# Patient Record
Sex: Female | Born: 1960 | Race: White | Hispanic: No | Marital: Single | Smoking: Former smoker
Health system: Southern US, Community
[De-identification: ages and names within clinical notes are randomized; demographics above are authoritative.]

## PROBLEM LIST (undated history)

## (undated) DIAGNOSIS — F32A Depression, unspecified: Secondary | ICD-10-CM

## (undated) DIAGNOSIS — F419 Anxiety disorder, unspecified: Secondary | ICD-10-CM

## (undated) DIAGNOSIS — F329 Major depressive disorder, single episode, unspecified: Secondary | ICD-10-CM

## (undated) DIAGNOSIS — R490 Dysphonia: Secondary | ICD-10-CM

## (undated) DIAGNOSIS — M545 Low back pain, unspecified: Secondary | ICD-10-CM

## (undated) DIAGNOSIS — T7840XA Allergy, unspecified, initial encounter: Secondary | ICD-10-CM

## (undated) DIAGNOSIS — E669 Obesity, unspecified: Secondary | ICD-10-CM

## (undated) DIAGNOSIS — IMO0002 Reserved for concepts with insufficient information to code with codable children: Secondary | ICD-10-CM

## (undated) DIAGNOSIS — G4733 Obstructive sleep apnea (adult) (pediatric): Secondary | ICD-10-CM

## (undated) HISTORY — PX: TONSILLECTOMY: SUR1361

## (undated) HISTORY — DX: Major depressive disorder, single episode, unspecified: F32.9

## (undated) HISTORY — DX: Obesity, unspecified: E66.9

## (undated) HISTORY — DX: Low back pain: M54.5

## (undated) HISTORY — DX: Depression, unspecified: F32.A

## (undated) HISTORY — DX: Low back pain, unspecified: M54.50

## (undated) HISTORY — DX: Anxiety disorder, unspecified: F41.9

## (undated) HISTORY — DX: Dysphonia: R49.0

## (undated) HISTORY — DX: Allergy, unspecified, initial encounter: T78.40XA

## (undated) HISTORY — DX: Reserved for concepts with insufficient information to code with codable children: IMO0002

## (undated) HISTORY — PX: OTHER SURGICAL HISTORY: SHX169

## (undated) HISTORY — DX: Obstructive sleep apnea (adult) (pediatric): G47.33

---

## 2006-01-23 ENCOUNTER — Ambulatory Visit: Payer: Self-pay | Admitting: Family Medicine

## 2006-01-23 DIAGNOSIS — F17200 Nicotine dependence, unspecified, uncomplicated: Secondary | ICD-10-CM | POA: Insufficient documentation

## 2006-01-23 DIAGNOSIS — G4733 Obstructive sleep apnea (adult) (pediatric): Secondary | ICD-10-CM

## 2006-02-07 ENCOUNTER — Ambulatory Visit: Payer: Self-pay | Admitting: Family Medicine

## 2006-04-17 ENCOUNTER — Ambulatory Visit: Payer: Self-pay | Admitting: Family Medicine

## 2006-05-07 ENCOUNTER — Encounter: Payer: Self-pay | Admitting: Family Medicine

## 2006-05-21 ENCOUNTER — Telehealth: Payer: Self-pay | Admitting: Family Medicine

## 2006-05-22 ENCOUNTER — Telehealth: Payer: Self-pay | Admitting: Family Medicine

## 2006-05-26 ENCOUNTER — Encounter: Payer: Self-pay | Admitting: Family Medicine

## 2006-06-11 ENCOUNTER — Telehealth (INDEPENDENT_AMBULATORY_CARE_PROVIDER_SITE_OTHER): Payer: Self-pay | Admitting: *Deleted

## 2006-06-13 ENCOUNTER — Telehealth (INDEPENDENT_AMBULATORY_CARE_PROVIDER_SITE_OTHER): Payer: Self-pay | Admitting: *Deleted

## 2006-06-26 ENCOUNTER — Encounter: Payer: Self-pay | Admitting: Family Medicine

## 2006-07-09 ENCOUNTER — Telehealth: Payer: Self-pay | Admitting: Family Medicine

## 2006-07-15 ENCOUNTER — Ambulatory Visit: Payer: Self-pay | Admitting: Family Medicine

## 2006-08-05 ENCOUNTER — Telehealth: Payer: Self-pay | Admitting: Family Medicine

## 2006-09-05 ENCOUNTER — Ambulatory Visit: Payer: Self-pay | Admitting: Family Medicine

## 2006-09-05 DIAGNOSIS — G47419 Narcolepsy without cataplexy: Secondary | ICD-10-CM | POA: Insufficient documentation

## 2006-09-05 DIAGNOSIS — N949 Unspecified condition associated with female genital organs and menstrual cycle: Secondary | ICD-10-CM

## 2006-09-05 DIAGNOSIS — M545 Low back pain: Secondary | ICD-10-CM

## 2006-09-05 LAB — CONVERTED CEMR LAB: Hemoglobin: 15.2 g/dL

## 2006-09-12 ENCOUNTER — Ambulatory Visit: Payer: Self-pay | Admitting: Pulmonary Disease

## 2006-10-03 ENCOUNTER — Ambulatory Visit (HOSPITAL_BASED_OUTPATIENT_CLINIC_OR_DEPARTMENT_OTHER): Admission: RE | Admit: 2006-10-03 | Discharge: 2006-10-03 | Payer: Self-pay | Admitting: Pulmonary Disease

## 2006-10-03 ENCOUNTER — Ambulatory Visit: Payer: Self-pay | Admitting: Pulmonary Disease

## 2006-10-10 ENCOUNTER — Telehealth: Payer: Self-pay | Admitting: Family Medicine

## 2006-10-19 ENCOUNTER — Encounter: Payer: Self-pay | Admitting: Family Medicine

## 2006-10-19 LAB — CONVERTED CEMR LAB
AST: 12 units/L (ref 0–37)
BUN: 13 mg/dL (ref 6–23)
Calcium: 9.6 mg/dL (ref 8.4–10.5)
LDL Cholesterol: 135 mg/dL — ABNORMAL HIGH (ref 0–99)
MCHC: 33.1 g/dL (ref 30.0–36.0)
MCV: 97.9 fL (ref 78.0–100.0)
Platelets: 262 10*3/uL (ref 150–400)
RDW: 13.6 % (ref 11.5–14.0)
Triglycerides: 124 mg/dL (ref <150)
VLDL: 25 mg/dL (ref 0–40)
WBC: 7.6 10*3/uL (ref 4.0–10.5)

## 2006-10-21 ENCOUNTER — Encounter: Payer: Self-pay | Admitting: Family Medicine

## 2006-12-12 ENCOUNTER — Ambulatory Visit: Payer: Self-pay | Admitting: Family Medicine

## 2006-12-12 ENCOUNTER — Encounter: Admission: RE | Admit: 2006-12-12 | Discharge: 2006-12-12 | Payer: Self-pay | Admitting: Family Medicine

## 2006-12-17 ENCOUNTER — Ambulatory Visit: Payer: Self-pay | Admitting: Family Medicine

## 2006-12-18 ENCOUNTER — Telehealth: Payer: Self-pay | Admitting: Family Medicine

## 2007-02-17 ENCOUNTER — Ambulatory Visit: Payer: Self-pay | Admitting: Family Medicine

## 2007-02-17 DIAGNOSIS — J42 Unspecified chronic bronchitis: Secondary | ICD-10-CM

## 2007-02-19 ENCOUNTER — Encounter: Payer: Self-pay | Admitting: Family Medicine

## 2007-02-21 ENCOUNTER — Encounter: Payer: Self-pay | Admitting: Family Medicine

## 2007-03-24 ENCOUNTER — Ambulatory Visit: Payer: Self-pay | Admitting: Pulmonary Disease

## 2007-03-27 ENCOUNTER — Ambulatory Visit: Payer: Self-pay | Admitting: Family Medicine

## 2007-03-27 ENCOUNTER — Encounter: Admission: RE | Admit: 2007-03-27 | Discharge: 2007-03-27 | Payer: Self-pay | Admitting: Family Medicine

## 2007-03-27 DIAGNOSIS — M542 Cervicalgia: Secondary | ICD-10-CM | POA: Insufficient documentation

## 2007-06-25 ENCOUNTER — Encounter: Payer: Self-pay | Admitting: Family Medicine

## 2007-06-30 ENCOUNTER — Telehealth (INDEPENDENT_AMBULATORY_CARE_PROVIDER_SITE_OTHER): Payer: Self-pay | Admitting: *Deleted

## 2007-07-01 ENCOUNTER — Ambulatory Visit: Payer: Self-pay | Admitting: Pulmonary Disease

## 2007-07-01 DIAGNOSIS — G4721 Circadian rhythm sleep disorder, delayed sleep phase type: Secondary | ICD-10-CM

## 2007-07-09 ENCOUNTER — Telehealth (INDEPENDENT_AMBULATORY_CARE_PROVIDER_SITE_OTHER): Payer: Self-pay | Admitting: *Deleted

## 2007-07-09 ENCOUNTER — Encounter: Payer: Self-pay | Admitting: Family Medicine

## 2007-11-14 ENCOUNTER — Telehealth: Payer: Self-pay | Admitting: Family Medicine

## 2008-03-03 ENCOUNTER — Ambulatory Visit: Payer: Self-pay | Admitting: Family Medicine

## 2008-03-03 DIAGNOSIS — F329 Major depressive disorder, single episode, unspecified: Secondary | ICD-10-CM

## 2008-03-03 DIAGNOSIS — R498 Other voice and resonance disorders: Secondary | ICD-10-CM | POA: Insufficient documentation

## 2008-03-03 DIAGNOSIS — B009 Herpesviral infection, unspecified: Secondary | ICD-10-CM | POA: Insufficient documentation

## 2008-03-17 ENCOUNTER — Encounter: Payer: Self-pay | Admitting: Family Medicine

## 2008-03-25 ENCOUNTER — Ambulatory Visit: Payer: Self-pay | Admitting: Family Medicine

## 2008-04-16 ENCOUNTER — Encounter: Payer: Self-pay | Admitting: Family Medicine

## 2008-04-19 ENCOUNTER — Telehealth: Payer: Self-pay | Admitting: Family Medicine

## 2008-05-04 ENCOUNTER — Ambulatory Visit: Payer: Self-pay | Admitting: Pulmonary Disease

## 2008-05-10 ENCOUNTER — Encounter: Payer: Self-pay | Admitting: Pulmonary Disease

## 2008-05-27 ENCOUNTER — Telehealth: Payer: Self-pay | Admitting: Family Medicine

## 2008-06-03 ENCOUNTER — Ambulatory Visit: Payer: Self-pay | Admitting: Family Medicine

## 2008-06-03 DIAGNOSIS — F411 Generalized anxiety disorder: Secondary | ICD-10-CM

## 2008-06-10 ENCOUNTER — Ambulatory Visit: Payer: Self-pay | Admitting: Family Medicine

## 2008-06-10 DIAGNOSIS — IMO0001 Reserved for inherently not codable concepts without codable children: Secondary | ICD-10-CM | POA: Insufficient documentation

## 2008-06-10 DIAGNOSIS — M2550 Pain in unspecified joint: Secondary | ICD-10-CM | POA: Insufficient documentation

## 2008-06-11 ENCOUNTER — Encounter: Payer: Self-pay | Admitting: Family Medicine

## 2008-06-14 ENCOUNTER — Telehealth: Payer: Self-pay | Admitting: Family Medicine

## 2008-06-14 DIAGNOSIS — E039 Hypothyroidism, unspecified: Secondary | ICD-10-CM | POA: Insufficient documentation

## 2008-06-14 LAB — CONVERTED CEMR LAB
AST: 11 units/L (ref 0–37)
Alkaline Phosphatase: 68 units/L (ref 39–117)
Anti Nuclear Antibody(ANA): POSITIVE — AB
BUN: 13 mg/dL (ref 6–23)
Basophils Relative: 1 % (ref 0–1)
Calcium: 10.2 mg/dL (ref 8.4–10.5)
Cholesterol: 182 mg/dL (ref 0–200)
Creatinine, Ser: 0.71 mg/dL (ref 0.40–1.20)
HCT: 46.6 % — ABNORMAL HIGH (ref 36.0–46.0)
HDL: 44 mg/dL (ref >39)
LDL Cholesterol: 113 mg/dL — ABNORMAL HIGH (ref 0–99)
Lymphs Abs: 3.1 10*3/uL (ref 0.7–4.0)
MCHC: 34.3 g/dL (ref 30.0–36.0)
Neutro Abs: 3.2 10*3/uL (ref 1.7–7.7)
Neutrophils Relative %: 44 % (ref 43–77)
Platelets: 232 10*3/uL (ref 150–400)
RBC: 4.85 M/uL (ref 3.87–5.11)
RDW: 14 % (ref 11.5–15.5)
Sodium: 141 meq/L (ref 135–145)
TSH: 5.646 microintl units/mL — ABNORMAL HIGH (ref 0.350–4.500)
Total Bilirubin: 0.4 mg/dL (ref 0.3–1.2)
Total CHOL/HDL Ratio: 4.1
Total CK: 33 units/L (ref 7–177)
Triglycerides: 124 mg/dL (ref <150)
VLDL: 25 mg/dL (ref 0–40)
WBC: 7.3 10*3/uL (ref 4.0–10.5)

## 2008-06-21 ENCOUNTER — Encounter: Payer: Self-pay | Admitting: Family Medicine

## 2008-07-14 ENCOUNTER — Other Ambulatory Visit: Admission: RE | Admit: 2008-07-14 | Discharge: 2008-07-14 | Payer: Self-pay | Admitting: Obstetrics & Gynecology

## 2008-07-14 ENCOUNTER — Encounter: Payer: Self-pay | Admitting: Obstetrics & Gynecology

## 2008-07-14 ENCOUNTER — Ambulatory Visit: Payer: Self-pay | Admitting: Obstetrics & Gynecology

## 2008-07-16 ENCOUNTER — Ambulatory Visit: Payer: Self-pay | Admitting: Physical Medicine & Rehabilitation

## 2008-07-16 ENCOUNTER — Encounter
Admission: RE | Admit: 2008-07-16 | Discharge: 2008-07-16 | Payer: Self-pay | Admitting: Physical Medicine & Rehabilitation

## 2008-07-19 ENCOUNTER — Ambulatory Visit: Payer: Self-pay | Admitting: Family Medicine

## 2008-07-19 ENCOUNTER — Encounter: Admission: RE | Admit: 2008-07-19 | Discharge: 2008-07-19 | Payer: Self-pay | Admitting: Obstetrics & Gynecology

## 2008-07-19 LAB — HM MAMMOGRAPHY: HM Mammogram: NEGATIVE

## 2008-07-26 ENCOUNTER — Encounter
Admission: RE | Admit: 2008-07-26 | Discharge: 2008-09-16 | Payer: Self-pay | Admitting: Physical Medicine & Rehabilitation

## 2008-07-28 ENCOUNTER — Ambulatory Visit (HOSPITAL_BASED_OUTPATIENT_CLINIC_OR_DEPARTMENT_OTHER): Admission: RE | Admit: 2008-07-28 | Discharge: 2008-07-28 | Payer: Self-pay | Admitting: Family Medicine

## 2008-07-28 ENCOUNTER — Ambulatory Visit: Payer: Self-pay | Admitting: Diagnostic Radiology

## 2008-07-29 ENCOUNTER — Telehealth: Payer: Self-pay | Admitting: Family Medicine

## 2008-09-02 ENCOUNTER — Ambulatory Visit: Payer: Self-pay | Admitting: Family Medicine

## 2008-09-02 DIAGNOSIS — M5030 Other cervical disc degeneration, unspecified cervical region: Secondary | ICD-10-CM

## 2008-10-12 ENCOUNTER — Ambulatory Visit: Payer: Self-pay | Admitting: Obstetrics & Gynecology

## 2009-01-03 ENCOUNTER — Ambulatory Visit: Payer: Self-pay | Admitting: Family Medicine

## 2009-06-03 ENCOUNTER — Ambulatory Visit: Payer: Self-pay | Admitting: Family Medicine

## 2009-06-03 DIAGNOSIS — J449 Chronic obstructive pulmonary disease, unspecified: Secondary | ICD-10-CM

## 2009-06-03 DIAGNOSIS — J4489 Other specified chronic obstructive pulmonary disease: Secondary | ICD-10-CM | POA: Insufficient documentation

## 2009-06-03 DIAGNOSIS — R0789 Other chest pain: Secondary | ICD-10-CM | POA: Insufficient documentation

## 2009-06-03 DIAGNOSIS — M940 Chondrocostal junction syndrome [Tietze]: Secondary | ICD-10-CM

## 2009-06-04 ENCOUNTER — Encounter: Payer: Self-pay | Admitting: Family Medicine

## 2009-06-24 ENCOUNTER — Ambulatory Visit: Payer: Self-pay | Admitting: Family Medicine

## 2009-06-24 DIAGNOSIS — E559 Vitamin D deficiency, unspecified: Secondary | ICD-10-CM | POA: Insufficient documentation

## 2009-06-27 ENCOUNTER — Telehealth: Payer: Self-pay | Admitting: Family Medicine

## 2009-06-27 LAB — CONVERTED CEMR LAB
ALT: 15 units/L (ref 0–35)
AST: 12 units/L (ref 0–37)
Albumin: 4.2 g/dL (ref 3.5–5.2)
CO2: 28 meq/L (ref 19–32)
Calcium: 9.6 mg/dL (ref 8.4–10.5)
Chloride: 106 meq/L (ref 96–112)
Cholesterol: 217 mg/dL — ABNORMAL HIGH (ref 0–200)
Glucose, Bld: 89 mg/dL (ref 70–99)
HDL: 38 mg/dL — ABNORMAL LOW (ref >39)
Sodium: 143 meq/L (ref 135–145)
VLDL: 31 mg/dL (ref 0–40)

## 2009-06-29 ENCOUNTER — Encounter (INDEPENDENT_AMBULATORY_CARE_PROVIDER_SITE_OTHER): Payer: Self-pay | Admitting: *Deleted

## 2009-08-08 ENCOUNTER — Encounter: Payer: Self-pay | Admitting: Family Medicine

## 2009-08-29 ENCOUNTER — Encounter: Payer: Self-pay | Admitting: Family Medicine

## 2009-09-01 ENCOUNTER — Encounter: Payer: Self-pay | Admitting: Family Medicine

## 2009-09-13 ENCOUNTER — Encounter: Payer: Self-pay | Admitting: Family Medicine

## 2009-10-03 ENCOUNTER — Encounter: Payer: Self-pay | Admitting: Family Medicine

## 2009-10-12 ENCOUNTER — Telehealth: Payer: Self-pay | Admitting: Family Medicine

## 2009-11-11 ENCOUNTER — Encounter: Payer: Self-pay | Admitting: Family Medicine

## 2010-01-31 NOTE — Letter (Signed)
Summary: Primary Care Consult Scheduled Letter  [facility] [place]  [address], [address]   [place], [place] [address]   Phone: [phone]  Fax: [phone]      06/29/2009 MRN: [id number]  [facility] [address] [place], [place]  [address]    Dear Ms. Agrusa,        We have scheduled an appointment for you.  At the recommendation of Dr.Bowen, we have scheduled you a consult with [facility] on Thursday July 14th at 10:45 for a stress echo.  Their address is [address], [place] [address]. The office phone number is [phone].  If this appointment day and time is not convenient for you, please feel free to call the office of the doctor you are being referred to at the number listed above and reschedule the appointment.     It is important for you to keep your scheduled appointments. We are here to make sure you are given good patient care.     Thank you, Michaelle Copas  Patient Care Coordinator [facility] Kathryne Sharper

## 2010-01-31 NOTE — Progress Notes (Signed)
Summary: Diazepam refill  Phone Note Refill Request   Refills Requested: Medication #1:  DIAZEPAM 5 MG  TABS 1 tab by mouth two times a day as needed anxiety Initial call taken by: Payton Spark CMA,  October 12, 2009 4:36 PM    Prescriptions: DIAZEPAM 5 MG  TABS (DIAZEPAM) 1 tab by mouth two times a day as needed anxiety  #60 x 1   Entered and Authorized by:   Seymour Bars DO   Signed by:   Seymour Bars DO on 10/12/2009   Method used:   Printed then faxed to ...       [facility]* (retail)       [address] [place], [place]  [address]       Ph: [phone]       Fax: [phone]   RxID:   [id number]

## 2010-01-31 NOTE — Progress Notes (Signed)
Summary: Cymbalta  Phone Note Call from Patient Call back at Home Phone [phone]   Caller: Patient Call For: Seymour Bars DO Summary of Call: Pt took the Cymbalta 30mg  and made her really nauseated and dizzy so hasn't taken it again. Wants to know if it was the med and should she continue it or if you wanted to change it. Initial call taken by: Kathlene November,  June 27, 2009 2:50 PM  Follow-up for Phone Call        This is a common SE and should improve with use over the next wk.  Make sure she takes it with a large meal.   Follow-up by: Seymour Bars DO,  June 27, 2009 2:55 PM  Additional Follow-up for Phone Call Additional follow up Details #1::        pt notified of MD instructions. Pt will continue to take med and if problem persist will call back. Additional Follow-up by: Kathlene November,  June 27, 2009 3:28 PM

## 2010-01-31 NOTE — Assessment & Plan Note (Signed)
Summary: f/u bronchiitis/ OSA/ LBP   Vital Signs:  Patient profile:   50 year old female Height:      64 inches Weight:      189 pounds BMI:     32.56 O2 Sat:      95 % on Room air Temp:     98.4 degrees F oral Pulse rate:   98 / minute BP sitting:   102 / 76  (left arm) Cuff size:   large  Vitals Entered By: Payton Spark CMA (January 03, 2009 2:48 PM)  O2 Flow:  Room air CC: 4 MO F/U   Primary Care Provider:  Seymour Bars DO  CC:  4 MO F/U.  History of Present Illness: 50 yo WF presents for f/u.  She has chronic neck and LBP.  Got a new matress.  Still not exercising.  Using TENS unit.  Doing some stretches.  Things going well at home.    Never did start the neurontin at night.  Still smoking and is due to f/u with Dr Craige Cotta.  Having some DOE.  Has OSA but is not using her CPAP b/c the 'mask does not fit right'. She is due to see Dr Vandling Lions for disordered sleeping problems.  She continues to have daytime sleep hrs.    Current Medications (verified): 1)  Xopenex Hfa 45 Mcg/act Aero (Levalbuterol Tartrate) .... 2 Puffs Q 4-6 H Prn 2)  Provigil 100 Mg Tabs (Modafinil) .... Take 1 Tablet By Mouth Once A Day Prn 3)  Diazepam 5 Mg  Tabs (Diazepam) .Marland Kitchen.. 1 Tab By Mouth Two Times A Day As Needed Anxiety 4)  Sertraline Hcl 100 Mg  Tabs (Sertraline Hcl) .Marland Kitchen.. 1 Tab By Mouth Daily 5)  Valtrex 1 Gm Tabs (Valacyclovir Hcl) .... 2 Tabs By Mouth Every 12 Hours X 1 Day 6)  Cleocin-T 1 % Gel (Clindamycin Phosphate) .... Apply To Facial Sores Two Times A Day 7)  Ibuprofen 800 Mg Tabs (Ibuprofen) .Marland Kitchen.. 1 Tab By Mouth Three Times A Day With Meals As Needed  Allergies (verified): 1)  ! Erythromycin  Past History:  Past Medical History: Reviewed history from 05/04/2008 and no changes required. 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  Allena Katz, ENT Narcolepsy OSA      - PSG 2008 AHI 16      - CPAP 12 cm H2O Chronic low back pain Degenerative disk disease  Social History: Reviewed history from 02/07/2006 and no changes required. Disabled due to OSA and narcolespy. Moved from [place] 2007.  Lesbian, in a relationship.  Finished HS.  Smokes 1ppx 30 yrs.  Exercises some.  Review of Systems      See HPI  Physical Exam  General:  obese in NAD Head:  normocephalic and atraumatic.   Eyes:  conjunctiva clear Nose:  nasal congestion present Neck:  full ROM and no masses.   Lungs:  Normal respiratory effort, chest expands symmetrically. Lungs are clear to auscultation, no crackles or wheezes. Heart:  Normal rate and regular rhythm. S1 and S2 normal without gallop, murmur, click, rub or other extra sounds. Extremities:  trace LE edema Skin:  color normal.   Psych:  good eye contact, not anxious appearing, and not depressed appearing.     Impression & Recommendations:  Problem # 1:  DISC DISEASE, CERVICAL (ICD-722.4) Assessment Improved Stable, a little improved with TENS unit and home stretching.  Never did start neurontin but doesn't think she needs this for her chronic nec/ back pain.  I do think that some regular exercise and wt loss would be beneficial.  Update labs in 6 mos for chronic use of NSAIDs.  Problem # 2:  DEPRESSION (ICD-311) Stable on current meds, stable home environment. Her updated medication list for this problem includes:    Diazepam 5 Mg Tabs (Diazepam) .Marland Kitchen... 1 tab by mouth two times a day as needed anxiety    Sertraline Hcl 100 Mg Tabs (Sertraline hcl) .Marland Kitchen... 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 Encouraged her to make behavioral changes to sleep at night and stay awake during the day. She has yet to make any changes and is non compliant with her CPAP.  Problem # 4:  BRONCHITIS, CHRONIC (ICD-491.9) Has f/u  with Dr Craige Cotta.   Encouraged smoking cessation. Lung exam unchanged.  Complete Medication List: 1)  Xopenex Hfa 45 Mcg/act Aero (Levalbuterol tartrate) .... 2 puffs q 4-6 h prn 2)  Provigil 100 Mg Tabs (Modafinil) .... Take 1 tablet by mouth once a day prn 3)  Diazepam 5 Mg Tabs (Diazepam) .Marland Kitchen.. 1 tab by mouth two times a day as needed anxiety 4)  Sertraline Hcl 100 Mg Tabs (Sertraline hcl) .Marland Kitchen.. 1 tab by mouth daily 5)  Valtrex 1 Gm Tabs (Valacyclovir hcl) .... 2 tabs by mouth every 12 hours x 1 day 6)  Cleocin-t 1 % Gel (Clindamycin phosphate) .... Apply to facial sores two times a day  Patient Instructions: 1)  F/u with Dr Craige Cotta for COPD. 2)  Work on smoking cessation. 3)  F/U with Dr Kekaha Lions for disordered sleeping/ OSA. 4)  Return for f/u with fasting labs in 6 months.

## 2010-01-31 NOTE — Letter (Signed)
Summary: Out of Work  [facility]  [address]   [place], [place] [address]   Phone: [phone]  Fax: [phone]    June 03, 2009   Employee:  [name]    To Whom It May Concern:   Tracey Norman was evaluated in our clinic today.  Please excuse her from work today.    If you need additional information, please feel free to contact our office.         Sincerely,    Donna Christen MD

## 2010-01-31 NOTE — Letter (Signed)
Summary: [facility]  [facility]   Imported By: Lanelle Bal 11/28/2009 11:10:35  _____________________________________________________________________  External Attachment:    Type:   Image     Comment:   External Document

## 2010-01-31 NOTE — Letter (Signed)
Summary: [facility]  [facility]   Imported By: Lanelle Bal 09/13/2009 11:22:08  _____________________________________________________________________  External Attachment:    Type:   Image     Comment:   External Document

## 2010-01-31 NOTE — Consult Note (Signed)
Summary: [facility]  [facility]   Imported By: Lanelle Bal 08/16/2009 11:40:05  _____________________________________________________________________  External Attachment:    Type:   Image     Comment:   External Document

## 2010-01-31 NOTE — Consult Note (Signed)
Summary: [facility]  [facility]   Imported By: Lanelle Bal 10/19/2009 10:18:39  _____________________________________________________________________  External Attachment:    Type:   Image     Comment:   External Document

## 2010-01-31 NOTE — Assessment & Plan Note (Signed)
Summary: CHEST DISCOMFORT   Vital Signs:  Patient Profile:   50 Years Old Female CC:      chest Pain, SOB O2 treatment:    Room Air  Pt. in pain?   yes    Location:   chest    Type:       heaviness, sharp at times  Vitals Entered By: Lajean Saver RN (June 03, 2009 4:11 PM)                   Updated Prior Medication List: DIAZEPAM 5 MG  TABS (DIAZEPAM) 1 tab by mouth two times a day as needed anxiety  Current Allergies (reviewed today): ! ERYTHROMYCINHistory of Present Illness Chief Complaint: chest Pain, SOB History of Present Illness: Subjective:  Patient complains of several month history of recurring intermittent sharp sternal chest pain and chest tightness, usually precipitated by anxiety/stress; the symptoms improve when she takes a valium.  She notes that she has been under significant stress for the past several months, and no longer takes Zoloft, although she does not feel depressed.   She states that she has shortness of breath with activity and would like to resume an inhaler.  No recent increase in cough; no fevers, chills, and sweats.  She continues to smoke.  She states that her lipid testing has been good in the past, but she has a significant family history of cardiovascular disease.  REVIEW OF SYSTEMS Constitutional Symptoms      Denies fever, chills, night sweats, weight loss, weight gain, and fatigue.  Eyes       Denies change in vision, eye pain, eye discharge, glasses, contact lenses, and eye surgery. Ear/Nose/Throat/Mouth       Denies hearing loss/aids, change in hearing, ear pain, ear discharge, dizziness, frequent runny nose, frequent nose bleeds, sinus problems, sore throat, hoarseness, and tooth pain or bleeding.  Respiratory       Complains of shortness of breath.      Denies dry cough, productive cough, wheezing, asthma, bronchitis, and emphysema/COPD.  Cardiovascular       Complains of chest pain.      Denies murmurs and tires easily with exhertion.     Gastrointestinal       Denies stomach pain, nausea/vomiting, diarrhea, constipation, blood in bowel movements, and indigestion. Genitourniary       Denies painful urination, kidney stones, and loss of urinary control. Neurological       Denies paralysis, seizures, and fainting/blackouts. Musculoskeletal       Denies muscle pain, joint pain, joint stiffness, decreased range of motion, redness, swelling, muscle weakness, and gout.  Skin       Denies bruising, unusual mles/lumps or sores, and hair/skin or nail changes.  Psych       Complains of anxiety/stress.      Denies mood changes, temper/anger issues, speech problems, depression, and sleep problems.  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 Allena Katz, ENT Narcolepsy OSA      - PSG 2008 AHI 16      - CPAP 12 cm H2O Chronic low back pain Degenerative disk disease  Past Surgical History: arthroscopic knee surgery Tonsillectomy  Family History: mother dementia,  GF DM father AMI, died 05-31-06 cousin pancreatic cancer MI- mother Family History of  Cardiovascular disorder- brother  Social History: Reviewed history from 02/07/2006 and no changes required. Disabled due to OSA and narcolespy. Moved from [place] 2007.  Lesbian, in a relationship.   Finished HS.   Smokes 1ppx 30 yrs.  Exercises some.   Objective:  Middle-aged obese female, alert and oriented and in no distress. Psychiatric:  Patient is alert and oriented with good eye contact.  Thoughts are organized.  No psychomotor retardation.  Good eye contact.  Memory intact.  Not suicidal.  Eyes:  Pupils are equal, round, and reactive to light and accomdation.  Extraocular movement is intact.  Conjunctivae are not inflamed.  Pharynx:  Normal  Neck:  Supple.  No adenopathy is present.   No thyromegaly is present  Lungs:  Few wheezes posteriorly.   Breath sounds are equal.  Chest:  Marked tenderness over the mid-sternum; palpation there recreates her chest discomfort Heart:  Regular rate and rhythm without murmurs, rubs, or gallops.  Abdomen:  Nontender without masses or hepatosplenomegaly.  Bowel sounds are present.  No CVA or flank tenderness.  Extremities:  No edema.   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  Plan New Medications/Changes: ADVAIR DISKUS 100-50 MCG/DOSE MISC (FLUTICASONE-SALMETEROL) 1 inhalation two times a day  #1 disp pk 60 x 0, 06/03/2009, Donna Christen MD NAPROXEN 375 MG TABS (NAPROXEN) 1 by mouth q12hr pc  #20 x 0, 06/03/2009, Donna Christen MD DIAZEPAM 5 MG  TABS (DIAZEPAM) 1 tab by mouth two times a day as needed anxiety  #30 x 0, 06/03/2009, Donna Christen MD  New Orders: T-Chest x-ray, 2 views [71020] New Patient Level IV [09811] EKG w/ Interpretation [93000] Planning Comments:   Begin Advair.  Begin Naproxen until chest tenderness improves.  Begin applying heat pad to chest several times daily.  Given a Water quality scientist patient information and instruction sheet on topic costochondritis. Follow-up with PCP for treatment of anxiety and consider smoking cessation. Recommend cardiology evaluation (consider exercise stress test with family history of CV disease and risk factors) If symptoms become significantly worse during the night or over the weekend, proceed to the local emergency room.   The patient and/or caregiver has been counseled thoroughly with regard to medications prescribed including dosage, schedule,  interactions, rationale for use, and possible side effects and they verbalize understanding.  Diagnoses and expected course of recovery discussed and will return if not improved as expected or if the condition worsens. Patient and/or caregiver verbalized understanding.  Prescriptions: ADVAIR DISKUS 100-50 MCG/DOSE MISC (FLUTICASONE-SALMETEROL) 1 inhalation two times a day  #1 disp pk 60 x 0   Entered and Authorized by:   Donna Christen MD   Signed by:   Donna Christen MD on 06/03/2009   Method used:   Print then Give to Patient   RxID:   [id number] NAPROXEN 375 MG TABS (NAPROXEN) 1 by mouth q12hr pc  #20 x 0   Entered and Authorized by:   Donna Christen MD   Signed by:   Donna Christen MD on 06/03/2009   Method used:   Print then Give to Patient   RxID:   [id number] DIAZEPAM 5 MG  TABS (DIAZEPAM) 1 tab by mouth two times a day as needed anxiety  #30 x 0  Entered and Authorized by:   Donna Christen MD   Signed by:   Donna Christen MD on 06/03/2009   Method used:   Print then Give to Patient   RxID:   [id number]   Orders Added: 1)  T-Chest x-ray, 2 views [71020] 2)  New Patient Level IV [99204] 3)  EKG w/ Interpretation [93000]  Appended Document: CHEST DISCOMFORT Vitals: BP: 130/82 HR: 86 RR: 20 O2sat: 97%

## 2010-01-31 NOTE — Assessment & Plan Note (Signed)
Summary: f/u chest pain/ depression/ fibromyalgia   Vital Signs:  Patient profile:   50 year old female Height:      64 inches Weight:      189 pounds BMI:     32.56 O2 Sat:      95 % on Room air Pulse rate:   98 / minute BP sitting:   120 / 78  (left arm) Cuff size:   large  Vitals Entered By: Payton Spark CMA (June 24, 2009 3:23 PM)  O2 Flow:  Room air CC: F/U UC    Primary Care Provider:  Seymour Bars DO  CC:  F/U UC .  History of Present Illness: 50 yo WF presents for f/u anxiety d/o and fibromyalgia.  She stopped her Zoloft in Jan on her own b/c she thought she didn't need it.  She is taking Diazepam on a regular basis.  She tried to see Dr Clementon Lions but she missed her 2nd scheduled appt.      Current Medications (verified): 1)  Diazepam 5 Mg  Tabs (Diazepam) .Marland Kitchen.. 1 Tab By Mouth Two Times A Day As Needed Anxiety 2)  Naproxen 375 Mg Tabs (Naproxen) .Marland Kitchen.. 1 By Mouth Q12hr Pc 3)  Advair Diskus 100-50 Mcg/dose Misc (Fluticasone-Salmeterol) .Marland Kitchen.. 1 Inhalation Two Times A Day  Allergies (verified): 1)  ! Erythromycin  Past History:  Past Medical History: Reviewed history from 06/03/2009 and no changes required. 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 Allena Katz, ENT Narcolepsy OSA      - PSG 2008 AHI 16      - CPAP 12 cm H2O Chronic low back pain Degenerative disk disease  Family History: Reviewed history from 06/03/2009 and no changes required. mother dementia,  GF DM father AMI, died 05/15/06 cousin pancreatic cancer MI- mother Family History of Cardiovascular disorder- brother  Social History: Reviewed history from 06/03/2009 and no changes required. Disabled due to OSA and narcolespy. Moved from [place] 14-May-2005.  Lesbian, in a relationship.   Finished HS.   Smokes 1ppx 30 yrs.  Exercises some.  Review of  Systems      See HPI  Physical Exam  General:  alert, well-developed, well-nourished, and well-hydrated.  obese Mouth:  pharynx pink and moist.   Neck:  no masses.   Lungs:  Normal respiratory effort, chest expands symmetrically. Lungs are clear to auscultation, no crackles or wheezes. Heart:  Normal rate and regular rhythm. S1 and S2 normal without gallop, murmur, click, rub or other extra sounds. Cervical Nodes:  No lymphadenopathy noted Psych:  good eye contact, not depressed appearing, and slightly anxious.     Impression & Recommendations:  Problem # 1:  GENERALIZED ANXIETY DISORDER (ICD-300.02) Assessment Deteriorated Add Cymbalta for mood and fibromyalgia.  Call if any problems.  RTC in 2 mos. Her updated medication list for this problem includes:    Diazepam 5 Mg Tabs (Diazepam) .Marland Kitchen... 1 tab by mouth two times a day as needed anxiety    Cymbalta 60 Mg Cpep (Duloxetine hcl) .Marland Kitchen... 1 capsule by mouth daily  Problem # 2:  OBSTRUCTIVE SLEEP APNEA (ICD-327.23) Refer to Dr Gaetano Net to follow.  This is the 2nd attempt. Orders: Neurology Referral (Neuro)  Problem # 3:  FIBROMYALGIA (ICD-729.1) Should see improvement with addition of Cymbalta and regular use of Naprosyn.  F/U in  2 mos. Her updated medication list for this problem includes:    Naprosyn 500 Mg Tabs (Naproxen) .Marland Kitchen... 1 tab by mouth two times a day with meals as needed for pain  Complete Medication List: 1)  Diazepam 5 Mg Tabs (Diazepam) .Marland Kitchen.. 1 tab by mouth two times a day as needed anxiety 2)  Naprosyn 500 Mg Tabs (Naproxen) .Marland Kitchen.. 1 tab by mouth two times a day with meals as needed for pain 3)  Advair Diskus 100-50 Mcg/dose Misc (Fluticasone-salmeterol) .Marland Kitchen.. 1 inhalation two times a day 4)  Cymbalta 60 Mg Cpep (Duloxetine hcl) .Marland Kitchen.. 1 capsule by mouth daily  Other Orders: Stress Echo (Stress Echo) T-Comprehensive Metabolic Panel [phone]) T-Lipid Profile [phone]) T-Vitamin D (25-Hydroxy)  ([id number])  Patient Instructions: 1)  REferral made to Dr Gaetano Net for sleep d/o. 2)  Referral made for stress Echo to evaluate your heart. 3)  Work on smoking cessation. 4)  Update fasting labs. 5)  Start Cymbalta 30 mg daily x 1 wk then go up to 60 mg once a day (everyday) for mood and fibromyalgia. 6)  Use Naprosyn up to 2 x a day with food for aches and pains. 7)  Return for f/u in 2 mos. Prescriptions: NAPROSYN 500 MG TABS (NAPROXEN) 1 tab by mouth two times a day with meals as needed for pain  #60 x 1   Entered and Authorized by:   Seymour Bars DO   Signed by:   Seymour Bars DO on 06/24/2009   Method used:   Electronically to        [facility]* (retail)       [address] [place], [place]  [address]       Ph: [phone]       Fax: [phone]   RxID:   [id number] CYMBALTA 60 MG CPEP (DULOXETINE HCL) 1 capsule by mouth daily  #30 x 3   Entered and Authorized by:   Seymour Bars DO   Signed by:   Seymour Bars DO on 06/24/2009   Method used:   Electronically to        [facility]* (retail)       [address] [place], [place]  [address]       Ph: [phone]       Fax: [phone]   RxID:   [id number]

## 2010-01-31 NOTE — Miscellaneous (Signed)
Summary: stress echo: normal  Clinical Lists Changes  Observations: Added new observation of ETTECHOFIND: done at [facility]  LV EF 60-65% normal valves normal diastolic function (09/01/2009 10:93)      Stress Echocardiogram  Procedure date:  09/01/2009  Findings:      done at [facility]  LV EF 60-65% normal valves normal diastolic function   Stress Echocardiogram  Procedure date:  09/01/2009  Findings:      done at [facility]  LV EF 60-65% normal valves normal diastolic function  Appended Document: stress echo: normal Pls let pt know that her stress echo came back completely normal.  Seymour Bars, D.O.  Appended Document: stress echo: normal 09/14/2009 @ 8:01am- Pt notified of results. KJ LPN

## 2010-05-04 ENCOUNTER — Other Ambulatory Visit: Payer: Self-pay | Admitting: Family Medicine

## 2010-05-16 ENCOUNTER — Other Ambulatory Visit: Payer: Self-pay | Admitting: Family Medicine
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
## 2010-05-16 NOTE — Assessment & Plan Note (Signed)
Tracey Norman, Tracey Norman             ACCOUNT NO.:  [id number]   MEDICAL RECORD NO.:  [id number]          PATIENT TYPE:  POB   LOCATION:  [facility] [place]         FACILITY:  [facility]   PHYSICIAN:  Allie Bossier, MD        DATE OF BIRTH:  01-Aug-1960   DATE OF SERVICE:  07/14/2008                                  CLINIC NOTE   Tracey Norman is a 50 year old, single, white, gravida 1, para 0, abortus  1, who comes in here for an annual exam.  She also wants to discuss  perimenopause.  She has begin skipping periods over the last several  years and experiencing night sweats and hot flashes.   PAST MEDICAL HISTORY:  Obesity, fibromyalgia, sleep apnea, atypical  narcolepsy, asthma.  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   REVIEW OF SYSTEMS:  She denies a history of abnormal Pap smear.  Her  last Pap smear was approximately 5 years ago.  Her last mammogram was  several years ago as well.  She is in monogamous lesbian relationship  for the last 11 years.  She works part-time at [employer] for the [employer], however, she is technically on disability secondary to  narcolepsy. She denies dyspareunia.  The remainder of her review of  systems is negative.   PAST SURGICAL HISTORY:  Left knee arthroscopy, right femur ORIF, a D and  C for an elective AB, and a tonsillectomy.   SOCIAL HISTORY:  Pack a day smoker for the 30 years, rare alcohol, and  she does use marijuana.   FAMILY HISTORY:  Negative for breast, GYN cancer, possibly colon cancer  in her brother.   ALLERGIES:  No known drug allergies.  No to LATEX allergies.   MEDICATIONS:  1. She has prescriptions for diazepam 5 mg.  2. Sertraline 100 mg a day.  3. Proventil 100 mg.  She says she takes these on a p.r.n. basis.  I      have explained sertraline shot does not work on a p.r.n. basis and      suggested that she restart that for her menopausal symptoms.   PHYSICAL EXAMINATION:  VITAL SIGNS:  Weight 187, height 5 feet 3 inches,  blood pressure 119/77, pulse 80.  HEENT:  Normal.  HEART:  Regular rhythm.  LUNGS:  Clear to auscultation bilaterally.  BREASTS:  Normal bilaterally.  ABDOMEN:  Obese.  No palpable hepatosplenomegaly.  EXTERNAL GENITALIA:  No lesions.  Cervix nulliparous.  Uterus mid plane,  nontender, nonenlarged.  Adnexa nonpalpable.   ASSESSMENT AND PLAN:  Annual exam.  I checked a Pap smear and scheduled  a mammogram.  I have referred with regard to her perimenopausal  symptoms.  Recommended, she restart her sertraline and come back in 2  months, and tell me if that is adequate treatment or if she would be  interested in hormone replacement therapy.      Allie Bossier, MD     MCD/MEDQ  D:  07/14/2008  T:  07/15/2008  Job:  [id number]

## 2010-05-16 NOTE — Assessment & Plan Note (Signed)
[name], [name]             ACCOUNT NO.:  [id number]   MEDICAL RECORD NO.:  [id number]          PATIENT TYPE:  POB   LOCATION:  [facility] [place]         FACILITY:  [facility]   PHYSICIAN:  Allie Bossier, MD        DATE OF BIRTH:  [birth year]   DATE OF SERVICE:  10/12/2008                                  CLINIC NOTE   Tracey Norman is a 50 year old woman who comes in here for followup of her  perimenopausal symptoms.  When I saw her in July, she was complaining of  skipping periods and symptomatic night sweats and hot flashes.  In the  past, she had been on sertraline, and we discussed whether she should  restart her sertraline or to start hormone replacement therapy.  She  decided that she would like to try the sertraline approach first, and  however, due to her narcolepsy, she says that she finds it difficult to  remember to take her sertraline on a daily basis.  She, in fact, only  takes it 2-3 times a week.  Today, she says that she is willing to try a  hormone replacement therapy, but pills would not work well since she has  difficulty to remember to take them.  I have given her  combi pro.  She  says that she thinks that she will be able to remember to change her  patch on a weekly basis.  She will come back in 4 months or sooner as  necessary.      Allie Bossier, MD     MCD/MEDQ  D:  10/12/2008  T:  10/13/2008  Job:  [id number]

## 2010-05-16 NOTE — Assessment & Plan Note (Signed)
[facility]                             PULMONARY OFFICE NOTE   Tracey Norman, Tracey Norman                      MRN:          [id number]  DATE:09/12/2006                            DOB:          10-02-1960    I met Tracey Norman today for evaluation of her sleep apnea and narcolepsy.  She says that she had a sleep test done about 17-18 years ago while she  was in [place] and apparently was initially told that she had delayed  sleep phase syndrome.  She then had a sleep test done in the early  1990s, in [place] and at that time was told she had obstructive  sleep apnea as well as narcolepsy, although from what I can tell it does  not sound like she ever actually got treated for her sleep apnea first  before they made their assessment for narcolepsy.  She had tried using  CPAP therapy in the early 1990s, but this caused her to get a dry throat  and a cough and as a result she was not able to use it.  She had tried  using an auto CPAP machine for the last 2 months and said that this  helped to some degree.   Her current sleep pattern is that she goes to bed at around 2 in the  morning, although she often times will fall asleep while watching TV.  She sleeps for about 2-3 hours and wakes up with a coughing and gagging  sensation.  She will then go to the bathroom and she had this pattern  recur until she wakes up between 9:30 in the morning and 2 in the  afternoon.  When she wakes up she still feels tired as well as complains  of having a headache.  She does snore and has been told that she stops  breathing as well.  She has trouble sleeping on her back.  She does  breath through her mouth and wakes up sometimes feeling sweaty.  She has  vivid dreams.  She does talk in her sleep occasionally and has problems  with her temporomandibular joint.  She also will get funny feelings in  her legs.   She uses Provigil as needed to help her stay awake during the  day.  She  is not using anything to help her falls asleep at night, although she  sometimes will take Valium for anxiety.  There is no history of sleep  hallucinations, sleep paralysis, or cataplexy.  She denies any other  abnormal behaviors while asleep.   PAST MEDICAL HISTORY:  1. Depression.  2. Endometriosis.  3. Asthma.  4. Allergies.  5. Sleep apnea.  6. Tonsillectomy.  7. Knee arthroscopy.   MEDICATIONS:  1. Provigil 100 mg as needed.  2. Allegra D 1 tablet b.i.d.  3. Sertraline 100 mg daily.  4. Singulair 10 mg daily.  5. Flonase 2 sprays daily.  6. Xopenex 2 puffs as needed.  7. Valium 5 mg as needed.  8. Mobic as needed.  9. Flexeril as needed.   ALLERGIES:  ERYTHROMYCIN.  SOCIAL HISTORY:  She is single.  She continues to smoke a pack of  cigarettes a day and has done so for about 30 years.  She denies any  significant alcohol use.   FAMILY HISTORY:  Significant for her parents who have heart disease.   REVIEW OF SYSTEMS:  Unremarkable.  She has gained approximately 30  pounds over the last 3 months.   PHYSICAL EXAMINATION:  VITAL SIGNS:  She is 5 feet 3 inches tall, 196  pounds, temperature is 98.5, blood pressure 116/86, heart rate is 87,  oxygen saturation is 93% on room air.  HEENT:  Pupils reactive.  There is no sinus tenderness.  No nasal  discharge.  She has a Mallampati III airway.  There is no  lymphadenopathy.  HEART:  S1 S2.  CHEST:  Revealed no wheezing or rales.  ABDOMEN:  Obese, soft, nontender.  EXTREMITIES:  No edema.  NEUROLOGIC:  No focal deficits were appreciated.   IMPRESSION:  1. Sleep disruption with excessive daytime sleepiness with a previous      history of obstructive sleep apnea as well as narcolepsy.  She      really has not had any symptoms which would be consistent with      narcolepsy, although it is possible that her being on Provigil as      well as sertraline could be masking some of these symptoms.  But it      seems  like even before she was on these medications she did not      really have anything to go along with narcolepsy.  What I suspect      is she actually has sleep apnea and this is what is causing her      symptoms.  What I would suggest is that we further evaluate her for      the possibility of sleep apnea and treat this first and then      reassess her symptom status to determine if she has anything that      would be suggestive of narcolepsy.  In the meantime, I have      discussed with her the importance of diet, exercise, and weight      reduction as well as avoidance of alcohol and sedatives.  Driving      precautions were reviewed with her as well.  2. Symptoms suggestive of restless leg syndrome.  This appears to be      somewhat mild at the present time.  And, I would defer further      evaluation of this until after we have her sleep apnea better under      control.   I will follow up with her in about 6-8 weeks.     Coralyn Helling, MD  Electronically Signed    VS/MedQ  DD: 09/20/2006  DT: 09/20/2006  Job #: [id number]   cc:   Seymour Bars, D.O.

## 2010-05-16 NOTE — Procedures (Signed)
Tracey Norman, [name]             ACCOUNT NO.:  [id number]   MEDICAL RECORD NO.:  [id number]          PATIENT TYPE:  OUT   LOCATION:  S[facility]EP CENTER                 FACILITY:  [facility]   PHYSICIAN:  Coralyn Helling, MD        DATE OF BIRTH:  1960/05/03   DATE OF STUDY:  09/24/2006                            NOCTURNAL POLYSOMNOGRAM   REFERRING PHYSICIAN:   REFERRING PHYSICIAN:  Coralyn Helling, M.D.   INDICATION FOR STUDY:  This is an individual who carries a diagnosis of  sleep apnea and narcolepsy.  She is referred to the sleep lab for a CPAP  titration study.   EPWORTH SLEEPINESS SCORE:  7   MEDICATIONS:  Xopenex, sertraline, diazepam, Singulair, Provigil,  Flonase, and Allegra.   SLEEP ARCHITECTURE:  Total recording time was 367 minutes, total sleep  time was 208 minutes, sleep efficiency is 57%, sleep latency is 31  minutes which is prolonged.  REM latency is 66 minutes.  The study was  notable for lack of slow wave sleep.  The patient slept in both the  supine and non-supine positions.   RESPIRATORY DATA:  The average respiratory rate was 18.  The patient was  titrated from a CPAP pressure setting of 5 to 11 cm of water.  At a CPAP  pressure setting of 10 cm of water, the apnea/hypopnea index was reduced  to 0.  At this pressure setting the patient was observed in REM sleep,  but not supine sleep.  She appeared to have increased sleep disruption  at higher pressure settings.  Snoring was also eliminated.   OXYGEN DATA:  The baseline oxygenation was 92%.  The oxygen saturation  nadir was 86%.  At a CPAP pressure setting of 10 cm of water, the mean  oxygenation during non-REM sleep was 91%.  The mean oxygenation during  REM sleep was 91%.  The minimal oxygenation during non-REM sleep was  87%, and the minimal oxygen during REM sleep was 88%.   CARDIAC DATA:  The average heart rate was 82 and the rhythm strip showed  normal sinus rhythm.   MOVEMENT-PARASOMNIA:  The periodic limb  movement index was 0.  The  patient also did have frequent episodes of coughing during the study.  The patient had one restroom trip.   IMPRESSIONS-RECOMMENDATIONS:  This is a CPAP titration study.  At a CPAP  titration study of 10 cm of water, the apnea/hypopnea index was reduced  to 0.  At this pressure setting, snoring was eliminated and the patient  was observed in REM sleep, but not supine sleep.  Of note also is that  she did not require the use of supplemental  oxygen.  What I would recommend is to start the patient on CPAP at 10 cm  of water and monitor her for her clinical response as well as to have  her undergo an overnight oximetry once she is stabilized on CPAP.      Coralyn Helling, MD  Diplomat, American Board of Sleep Medicine  Electronically Signed     VS/MEDQ  D:  10/18/2006 10:05:49  T:  10/19/2006 10:45:31  Job:  [id number]

## 2010-05-16 NOTE — Procedures (Signed)
NAMEWILEY, FLICKER             ACCOUNT NO.:  [id number]   MEDICAL RECORD NO.:  [id number]          PATIENT TYPE:  OUT   LOCATION:  SLEEP CENTER                 FACILITY:  [facility]   PHYSICIAN:  Coralyn Helling, MD        DATE OF BIRTH:  17-Mar-1960   DATE OF STUDY:  10/03/2006                            NOCTURNAL POLYSOMNOGRAM   REFERRING PHYSICIAN:   INDICATION FOR STUDY:  This is an individual who has a previous  diagnosis of sleep apnea and narcolepsy.  She has not been on therapy  recently for her sleep apnea.  She is referred to the sleep lab to  document whether she still has persistent sleep disorder breathing.   EPWORTH SLEEPINESS SCORE:  8   MEDICATIONS:  Xopenex, diazepam, Provigil, Allegra, sertraline,  Singulair, Flonase.   SLEEP ARCHITECTURE:  Total recording time was 417 minutes.  Total sleep  time was 368 minutes.  Sleep efficiency was 88%.  Sleep latency is 14  minutes.  REM latency 80.5 minutes. The patient was observed in all  stages of sleep, but had a reduction in percentage of slow wave sleep to  2% of the study.   RESPIRATORY DATA:  The average respiratory rate was 16.  The overall  apnea/hypopnea index was 16.  The events were exclusively obstructive in  nature.  The supine apnea/hypopnea index was 31, the non-supine  apnea/hypopnea index was 11, the REM apnea/hypopnea index was 67, the  non-REM apnea/hypopnea index was 9.  Loud snoring was noted by the  technician.   OXYGEN DATA:  The baseline oxygenation was 94%.  The oxygen saturation  nadir was 73%. The patient spent a total of 4.5 minutes with an oxygen  saturation between 71-80%, 260 minutes with an oxygen saturation between  81-90%, and 138 minutes with oxygen saturation between 91-100%.   CARDIAC DATA:  The average heart rate was 79 and the rhythm strip showed  normal sinus rhythm.   MOVEMENT-PARASOMNIA:  The periodic limb movement index was 0.7.  The  patient had one restroom trip.   IMPRESSIONS-RECOMMENDATIONS:  This study shows evidence of moderate to  severe obstructive sleep apnea as demonstrated by an apnea/hypopnea  index of 16 and an oxygen saturation nadir of 73%.  She did have a  significant positional as well as REM effect.  In addition she had  persistent hypoxemia.  What I would recommend is  that she undergo therapy for her sleep disorder breathing and then  reassess whether she needs the addition of supplemental oxygen.      Coralyn Helling, MD  Diplomat, American Board of Sleep Medicine  Electronically Signed     VS/MEDQ  D:  10/18/2006 09:58:58  T:  10/19/2006 10:30:29  Job:  [id number]

## 2010-05-24 ENCOUNTER — Other Ambulatory Visit: Payer: Self-pay | Admitting: Family Medicine

## 2010-05-24 NOTE — Telephone Encounter (Signed)
Pt called and stated that her refill for diazepam 5 mg PO twice daily PRN had been denied.  Calling to inquire.  Pt last refill 10-12-09.  Last OV for this problem looks like 06-24-09 ( almost 1 year ago).  I would like to advise pt she needs and OV??  Would you agree?  Please advise. Plan"  Routed to Dr. Arlice Colt, LPN [facility]

## 2010-05-26 ENCOUNTER — Telehealth: Payer: Self-pay | Admitting: Family Medicine

## 2010-05-26 MED ORDER — DIAZEPAM 5 MG PO TABS: 5.0000 mg | ORAL_TABLET | Freq: Two times a day (BID) | ORAL | Status: DC | PRN

## 2010-05-26 NOTE — Telephone Encounter (Signed)
Pt informed by [facility] that her diazepam had been refilled for 30 tabs and pt instructed to follow up in 2 weeks in the office.  Told to call the front desk to schedule that appt with the provider. Jarvis Newcomer, LPN [facility]

## 2010-05-26 NOTE — Telephone Encounter (Signed)
I am going to fill her #30 tabs. Have her f/u with me in 2 wks.

## 2010-06-01 NOTE — Telephone Encounter (Signed)
Closed. Deniesha Stenglein, LPN /Triage  

## 2010-06-06 ENCOUNTER — Encounter: Payer: Self-pay | Admitting: Family Medicine

## 2010-06-07 ENCOUNTER — Encounter: Payer: Self-pay | Admitting: Family Medicine

## 2010-06-07 ENCOUNTER — Ambulatory Visit (INDEPENDENT_AMBULATORY_CARE_PROVIDER_SITE_OTHER): Payer: Medicare Other | Admitting: Family Medicine

## 2010-06-07 DIAGNOSIS — G47419 Narcolepsy without cataplexy: Secondary | ICD-10-CM

## 2010-06-07 DIAGNOSIS — R5381 Other malaise: Secondary | ICD-10-CM

## 2010-06-07 DIAGNOSIS — Z1329 Encounter for screening for other suspected endocrine disorder: Secondary | ICD-10-CM

## 2010-06-07 DIAGNOSIS — J449 Chronic obstructive pulmonary disease, unspecified: Secondary | ICD-10-CM

## 2010-06-07 DIAGNOSIS — Z13228 Encounter for screening for other metabolic disorders: Secondary | ICD-10-CM

## 2010-06-07 DIAGNOSIS — IMO0001 Reserved for inherently not codable concepts without codable children: Secondary | ICD-10-CM

## 2010-06-07 DIAGNOSIS — E785 Hyperlipidemia, unspecified: Secondary | ICD-10-CM

## 2010-06-07 DIAGNOSIS — R5383 Other fatigue: Secondary | ICD-10-CM

## 2010-06-07 DIAGNOSIS — M797 Fibromyalgia: Secondary | ICD-10-CM

## 2010-06-07 MED ORDER — FLUOXETINE HCL 20 MG PO TABS: ORAL_TABLET | ORAL | Status: DC

## 2010-06-07 NOTE — Assessment & Plan Note (Signed)
Continues to have pain and did not tolerate cymbalta.  Will try her on fluoxetine.  Call if any problems.  Can use Aleve prn for pain.  Needs to start regular exercise program.

## 2010-06-07 NOTE — Patient Instructions (Signed)
Start Fluoxetine 1/2 tab once a day for 1 wk then increase to a full tab daily. Call if any problems including worsening mood.  Take Aleve as needed for aches/ pains.  Work on getting regular exercise.  Return for Pulmonary Function testing and Flu shot in 3 mos.

## 2010-06-07 NOTE — Assessment & Plan Note (Signed)
She has refused to use her Advair or even rescue inhalers.  She is not intersted in quitting smoking at this time.  We discussed the proper treatment of COPD and smoking cessation.    I will set her up for PFTs with her flu shot if she is willing in 3 mos.

## 2010-06-07 NOTE — Assessment & Plan Note (Signed)
She is due to see Dr Gaetano Net back and will need to see him for a new RX of Ritalin.

## 2010-06-07 NOTE — Progress Notes (Signed)
  Subjective:    Patient ID: Tracey Norman, female    DOB: June 28, 1960, 50 y.o.   MRN: [id number]  HPI 50 yo WF presents for f/u visit.  She still suffers from a sleep disorder.  She is to f/u with Dr Gaetano Net.  She was diagnosed with OSA and narcolepsy but she failed to go back to pick up her Ritalin RX.  She is not compliant with her CPAP machine and still has an erradic sleep schedule.  She c/o pain all over from her fibromyalgia but she stopped her Cymbalta due to SEs.  She is not taking any NSAIDs or muscle relaxers.  She is irritable and anxious and interestd in restarting meds for her mood.  She continues to smoke with COPD and has refused to use her inhalers.    BP 110/75  Pulse 84  Ht 5\' 3"  (1.6 m)  Wt 191 lb (86.637 kg)  BMI 33.83 kg/m2  SpO2 93%    Review of Systems  Constitutional: Positive for fatigue.  Respiratory: Negative for shortness of breath.   Cardiovascular: Negative for chest pain, palpitations and leg swelling.  Psychiatric/Behavioral: Positive for sleep disturbance and dysphoric mood. Negative for suicidal ideas. The patient is nervous/anxious.        Objective:   Physical Exam  Constitutional: She appears well-developed and well-nourished.  Eyes: Conjunctivae are normal.  Neck: Neck supple. No thyromegaly present.  Cardiovascular: Normal rate and normal heart sounds.   Pulmonary/Chest: Effort normal. No respiratory distress. She has wheezes.  Musculoskeletal: She exhibits no edema.  Lymphadenopathy:    She has no cervical adenopathy.  Skin: Skin is warm and dry.  Psychiatric: She has a normal mood and affect.          Assessment & Plan:

## 2010-09-08 ENCOUNTER — Ambulatory Visit: Payer: Medicare Other | Admitting: Family Medicine

## 2010-09-11 ENCOUNTER — Ambulatory Visit: Payer: Medicare Other | Admitting: Family Medicine

## 2010-09-11 DIAGNOSIS — Z0289 Encounter for other administrative examinations: Secondary | ICD-10-CM

## 2010-11-08 ENCOUNTER — Other Ambulatory Visit: Payer: Self-pay | Admitting: *Deleted

## 2010-11-08 ENCOUNTER — Other Ambulatory Visit: Payer: Self-pay | Admitting: Family Medicine

## 2010-11-08 MED ORDER — FLUOXETINE HCL 20 MG PO TABS: ORAL_TABLET | ORAL | Status: DC

## 2010-11-08 MED ORDER — DIAZEPAM 5 MG PO TABS: 5.0000 mg | ORAL_TABLET | Freq: Two times a day (BID) | ORAL | Status: DC | PRN

## 2010-11-17 ENCOUNTER — Other Ambulatory Visit: Payer: Self-pay | Admitting: *Deleted

## 2010-11-28 ENCOUNTER — Encounter: Payer: Self-pay | Admitting: Family Medicine

## 2010-11-28 ENCOUNTER — Ambulatory Visit (INDEPENDENT_AMBULATORY_CARE_PROVIDER_SITE_OTHER): Payer: Medicare Other | Admitting: Family Medicine

## 2010-11-28 ENCOUNTER — Other Ambulatory Visit: Payer: Self-pay | Admitting: Family Medicine

## 2010-11-28 DIAGNOSIS — G5600 Carpal tunnel syndrome, unspecified upper limb: Secondary | ICD-10-CM

## 2010-11-28 DIAGNOSIS — IMO0001 Reserved for inherently not codable concepts without codable children: Secondary | ICD-10-CM

## 2010-11-28 DIAGNOSIS — M797 Fibromyalgia: Secondary | ICD-10-CM

## 2010-11-28 MED ORDER — PREGABALIN 50 MG PO CAPS: 50.0000 mg | ORAL_CAPSULE | Freq: Two times a day (BID) | ORAL | Status: DC

## 2010-11-28 NOTE — Progress Notes (Signed)
Subjective:    Patient ID: Tracey Norman, female    DOB: [birth year], 50 y.o.   MRN: [id number]  HPI Having numbness in her 5th digit and in the the last 3 digits on her right hand. Then also noticed numbness and tingling in her 5th digit on her left hands. Sometimes hard to remove caps.  The numbness starts bothering her when her elbow and shoulder bother her.    fibromyalgia-Started the prozac and said she started to get very aggressive on it so stopped it.  Didn't do well on cymbalta either. She says in the past that she was given Vicodin by Dr. Cathey Endow for her acute flares. She says typically when she has a fair amount of flare the first 3 days there were some aspirin she really feels she needs pain medication. She felt that the Vicodin did not help her as much as the Percocet at that point Dr. Cathey Endow decided to schedule her with the pain clinic. She did test positive for marijuana which she admits she smokes regularly and they would not prescribe her any further medications. She wanted to explain this to me said that I understand that sometimes she needs stronger pain medicine when she begins to have a flare. She has not tried Risk manager.     Review of Systems \ BP 99/71  Pulse 81  Wt 188 lb (85.276 kg)    Allergies  Allergen Reactions  . Cymbalta (Duloxetine Hcl)     GI upset  . Erythromycin     Past Medical History  Diagnosis Date  . Obesity   . Allergy   . Depression   . Anxiety   . Asthma   . Chronic bronchitis   . Chronic hoarseness     From [place]ink'e edema  . Narcolepsy   . OSA (obstructive sleep apnea)   . LBP (low back pain)     chronic  . DDD (degenerative disc disease)     Past Surgical History  Procedure Date  . Arthroscopic knee surgery   . Tonsillectomy     History   Social History  . Marital Status: Single    Spouse Name: N/A    Number of Children: N/A  . Years of Education: N/A   Occupational History  . Not on file.   Social History  Main Topics  . Smoking status: Current Everyday Smoker -- 1.0 packs/day for 30 years    Types: Cigarettes  . Smokeless tobacco: Not on file  . Alcohol Use:   . Drug Use:   . Sexually Active:      disabled due to OSA and narcolepsy, moved from [place] 07, lesbian and in relationship, finished HS, exercises some.   Other Topics Concern  . Not on file   Social History Narrative  . No narrative on file    Family History  Problem Relation Age of Onset  . Dementia Mother   . Heart attack Mother   . Diabetes      grandfather  . Heart attack Father     died 06/08/2006  . Pancreatic cancer Cousin   . Heart disease Brother   . Heart disease Other        Objective:   Physical Exam  Constitutional: She is oriented to person, place, and time. She appears well-developed and well-nourished.  HENT:  Head: Normocephalic and atraumatic.  Cardiovascular: Normal rate, regular rhythm and normal heart sounds.   Pulmonary/Chest: Effort normal and breath sounds normal.  Musculoskeletal:  She has normal range of motion and strength of her right shoulder and elbow. She is nontender over the medial or epicondyle. Wrist with normal range of motion. She did get some tingling in her fifth digit on her right hand with Tinnels sign but nothing with Phalen's. Hand and finger strength is 5 over 5. No swollen joints in either hand. She does have a knot in her right upper trapezius.  Neurological: She is alert and oriented to person, place, and time.  Skin: Skin is warm and dry.  Psychiatric: She has a normal mood and affect. Her behavior is normal.          Assessment & Plan:  Fibromyalgia - She would like to try lyrica. We discussed the potential side effects. Follow up in about 6 weeks to make sure still him well. I will have her start twice a day and increase slowly to 3 times a day. If she starts getting nausea or significant side effects we can decrease back down to one time a day and slowly work her  way up.  Carpal tunnel-I suspect based on her history drinking and that she has carpal tunnel where she may have impingement at the elbow or shoulder regions and she has persistent problems with these 2 areas as well. I would like to schedule her for an EMG to further delineate where she has potential nerve damage. This will help guide our treatment.  Narcolepsy-she never followed up with Dr. Orie Rout. I encouraged to do says she did feel that the stimulant was helpful. She says right now her sleep cycle is completely out of sync. I discussed how controlling her sleep cycle is very important to control and her fibromyalgia pain. It is also very important to help with consistency of taking her medications which she admits she is very poor.

## 2011-01-09 ENCOUNTER — Encounter: Payer: Self-pay | Admitting: Family Medicine

## 2011-01-09 ENCOUNTER — Ambulatory Visit (INDEPENDENT_AMBULATORY_CARE_PROVIDER_SITE_OTHER): Payer: Medicare Other | Admitting: Family Medicine

## 2011-01-09 VITALS — BP 109/72 | HR 67 | Wt 188.0 lb

## 2011-01-09 DIAGNOSIS — G4721 Circadian rhythm sleep disorder, delayed sleep phase type: Secondary | ICD-10-CM

## 2011-01-09 DIAGNOSIS — M797 Fibromyalgia: Secondary | ICD-10-CM

## 2011-01-09 DIAGNOSIS — IMO0001 Reserved for inherently not codable concepts without codable children: Secondary | ICD-10-CM

## 2011-01-09 MED ORDER — CITALOPRAM HYDROBROMIDE 20 MG PO TABS: ORAL_TABLET | ORAL | Status: DC

## 2011-01-09 MED ORDER — DIAZEPAM 5 MG PO TABS: 5.0000 mg | ORAL_TABLET | Freq: Two times a day (BID) | ORAL | Status: DC | PRN

## 2011-01-09 NOTE — Progress Notes (Signed)
  Subjective:    Patient ID: Tracey Norman, female    DOB: 1960-02-17, 51 y.o.   MRN: [id number]  HPI Took the lyrica for about 2 weeks. Then stopped it.  No SE, just felt it wasn't working.  Says her sleep schedule is still off and so has a hard time remembering to take her meds. She never  Saw Dr. Orie Rout.  She needs to reschedule bc she missed her appt. Hasn't rescheduled yet.  Says no afraid to make Dr.s appts because afraid she will oversleep.  Has done PT for her shoulder before and it was helpful. She doesn't want to take any meds that might make her gain weight.     Review of Systems     Objective:   Physical Exam  Constitutional: She appears well-developed and well-nourished.  HENT:  Head: Normocephalic and atraumatic.          Assessment & Plan:  Fibromyalgia - Discussed need for good sleep quality and exercise.  Again we had a discussion about the need for exericise and what her long term goals are. She says she wants to feel better and take something to help her but then also says she can't commite to being consistant to a medication regimen.   I reminded her that it is in her hands to take her medications and decide to be consistant and try to improve her quality of life.  We also discussed possibly and SSRI if cost is a big concern. We also discussed can consider PT for her fibro but she willh ave to make her appontments.She finally decided to try an SSRI. Will start citalopram. Had tried prozac in the past.  Discussed potential SE and expalined takes weeks to see if will herlp her fibro.   Sleep Disturbance - Also discussed the importance of getting back in to Neuro to work on her sleep disorder to help with her fibro.   Spent 25 min face to face in counselig and discussion.  Pt was very indecisive about what she really wanted to do or commit to.

## 2011-02-08 ENCOUNTER — Encounter: Payer: Self-pay | Admitting: Family Medicine

## 2011-02-08 ENCOUNTER — Ambulatory Visit (INDEPENDENT_AMBULATORY_CARE_PROVIDER_SITE_OTHER): Payer: Medicare Other | Admitting: Family Medicine

## 2011-02-08 VITALS — BP 128/86 | HR 76 | Wt 186.0 lb

## 2011-02-08 DIAGNOSIS — IMO0001 Reserved for inherently not codable concepts without codable children: Secondary | ICD-10-CM

## 2011-02-08 DIAGNOSIS — F17200 Nicotine dependence, unspecified, uncomplicated: Secondary | ICD-10-CM

## 2011-02-08 DIAGNOSIS — K589 Irritable bowel syndrome without diarrhea: Secondary | ICD-10-CM

## 2011-02-08 DIAGNOSIS — Z23 Encounter for immunization: Secondary | ICD-10-CM

## 2011-02-08 DIAGNOSIS — E785 Hyperlipidemia, unspecified: Secondary | ICD-10-CM

## 2011-02-08 DIAGNOSIS — M797 Fibromyalgia: Secondary | ICD-10-CM

## 2011-02-08 DIAGNOSIS — Z720 Tobacco use: Secondary | ICD-10-CM

## 2011-02-08 NOTE — Progress Notes (Signed)
  Subjective:    Patient ID: Tracey Norman, female    DOB: [birth year], 51 y.o.   MRN: [id number]  HPI Fibromyalgia- dong well on celexa.  Tolerating well. Taking most days. Has been on it for about a month. No SE.  Not sure really helping her fibromyalgia.  Sleep is some better.  Last week has found out may loose her part time job.  Still not exercising.  She thinks she might join [employer].     IBS-her stomach has been more upset lately. She says she's not sure why. She says it's been growing a lot. She admits she's had problems with her stomach on and off her entire life. She says that she has been more stressed recently when she found out that she may lose her part-time job and wonders if this could be causing more stomach upset. She denies any heartburn problems. She's not taking any medications for her stomach.  Review of Systems     Objective:   Physical Exam  Constitutional: She is oriented to person, place, and time. She appears well-developed and well-nourished.  HENT:  Head: Normocephalic and atraumatic.  Cardiovascular: Normal rate, regular rhythm and normal heart sounds.   Pulmonary/Chest: Effort normal and breath sounds normal.  Musculoskeletal:       She's tender over the upper thoracic spine as well as the lumbar paraspinous muscles today.  Neurological: She is alert and oriented to person, place, and time.  Skin: Skin is warm and dry.  Psychiatric: She has a normal mood and affect. Her behavior is normal.          Assessment & Plan:  Fibromyalgia - Doing well so well.  Mild improvement. Continue celexa. Work on starting regular exercise. I reassured her once again and getting moving and getting some regular exercise does improve pain scores. Discussed smoking cessation.   IBS - Discussed increasing fiber and maybe a probiotic.  Coniser starting benefiber.  We also discussed making the right dietary choices. She admits she had buffalo wings a couple times this  week and explained her that this could upset anyone stomach including someone who has IBS.  Hyperlpidemia- Over due for labs. Slip given today. We will call with the results.   Tdap given today.

## 2011-03-14 ENCOUNTER — Encounter: Payer: Self-pay | Admitting: *Deleted

## 2011-03-22 ENCOUNTER — Other Ambulatory Visit: Payer: Self-pay | Admitting: Family Medicine

## 2011-03-22 ENCOUNTER — Ambulatory Visit (INDEPENDENT_AMBULATORY_CARE_PROVIDER_SITE_OTHER): Payer: Medicare Other | Admitting: Family Medicine

## 2011-03-22 ENCOUNTER — Encounter: Payer: Self-pay | Admitting: Family Medicine

## 2011-03-22 VITALS — BP 119/81 | HR 83 | Ht 63.0 in | Wt 187.0 lb

## 2011-03-22 DIAGNOSIS — Z720 Tobacco use: Secondary | ICD-10-CM

## 2011-03-22 DIAGNOSIS — J45909 Unspecified asthma, uncomplicated: Secondary | ICD-10-CM | POA: Insufficient documentation

## 2011-03-22 DIAGNOSIS — E039 Hypothyroidism, unspecified: Secondary | ICD-10-CM

## 2011-03-22 DIAGNOSIS — F411 Generalized anxiety disorder: Secondary | ICD-10-CM

## 2011-03-22 DIAGNOSIS — G4720 Circadian rhythm sleep disorder, unspecified type: Secondary | ICD-10-CM

## 2011-03-22 DIAGNOSIS — F17200 Nicotine dependence, unspecified, uncomplicated: Secondary | ICD-10-CM

## 2011-03-22 DIAGNOSIS — J069 Acute upper respiratory infection, unspecified: Secondary | ICD-10-CM

## 2011-03-22 MED ORDER — ALBUTEROL SULFATE (2.5 MG/3ML) 0.083% IN NEBU
2.5000 mg | INHALATION_SOLUTION | Freq: Four times a day (QID) | RESPIRATORY_TRACT | Status: DC | PRN
  Administered 2011-03-22: 2.5 mg via RESPIRATORY_TRACT

## 2011-03-22 MED ORDER — ALBUTEROL SULFATE HFA 108 (90 BASE) MCG/ACT IN AERS: 2.0000 | INHALATION_SPRAY | Freq: Four times a day (QID) | RESPIRATORY_TRACT | Status: DC | PRN

## 2011-03-22 MED ORDER — SERTRALINE HCL 100 MG PO TABS: ORAL_TABLET | ORAL | Status: DC

## 2011-03-22 NOTE — Progress Notes (Signed)
  Subjective:    Patient ID: Tracey Norman, female    DOB: 08-03-1960, 51 y.o.   MRN: [id number]  HPI IBS - is doing better. Didn't try the probiotic  Hasn't gotten labs.  Needs slip repinted. She lost it.   GAD- She would like to change to sertraline. Her mom and sister are coming to visit and this is stressful.  Lost her part-time job.  Has been stressed, says has days of feeling like giving up. She is also trying to quit smoking.  Had episode of chest pain and SOB but says felt it was from being exhausted and not sleeping at times.    She also feels she is coming down with a cold. She had some nasal congestion and cough for the last couple days. No fever. She says she has a history of asthma. She denies any wheezing but has a little tight in her chest. She says she used to be on inhalers but it's been a long time since she's had them. She does report having a neb machine at home. She says she can't remember the last time she actually used it. She is a smoker, but is trying to quit   Sleep disorder-she is still really struggling with her sleep right now.  Review of Systems     Objective:   Physical Exam  Constitutional: She is oriented to person, place, and time. She appears well-developed and well-nourished.  HENT:  Head: Normocephalic and atraumatic.  Right Ear: External ear normal.  Left Ear: External ear normal.  Nose: Nose normal.  Mouth/Throat: Oropharynx is clear and moist.       TMs and canals are clear. Tongue peircing.   Eyes: Conjunctivae and EOM are normal. Pupils are equal, round, and reactive to light.  Neck: Neck supple. No thyromegaly present.  Cardiovascular: Normal rate, regular rhythm and normal heart sounds.   Pulmonary/Chest: Effort normal and breath sounds normal. She has no wheezes.  Lymphadenopathy:    She has no cervical adenopathy.  Neurological: She is alert and oriented to person, place, and time.  Skin: Skin is warm and dry.  Psychiatric: She has a  normal mood and affect. Her behavior is normal.          Assessment & Plan:  GAD-we'll change her to sertraline suggest him on this in the past. Has several indications for anxiety which I think be very helpful for her. I would like to see her back in 6 weeks. She's not taking the citalopram consistently anyway.  Viral URI-symptomatic care. This is most likely viral., She is still having symptoms after 10 days. She does have a prior history of asthma and was not wheezing on exam but her peak flows were in the yellow zone. We gave her a neb treatment here in the office and she did come up slightly. I sent a prescription for albuterol inhaler to her pharmacy so she can have this if she starts to cough, or hepatitis in her chest, or wheezing. I will also send her for a prescription for vitals and she does have a neb machine at home.  Asthma - See above.   Tobacco abuse-I would also like to schedule her first from a tree when she is feeling better. I suspect she has some element of COPD. It sounds like she did have testing done a couple years ago but it has been a while.  She will go for labs today since she is fasting.

## 2011-03-23 ENCOUNTER — Other Ambulatory Visit: Payer: Self-pay | Admitting: Family Medicine

## 2011-03-23 LAB — COMPLETE METABOLIC PANEL WITH GFR
AST: 17 U/L (ref 0–37)
Alkaline Phosphatase: 70 U/L (ref 39–117)
BUN: 11 mg/dL (ref 6–23)
CO2: 31 mEq/L (ref 19–32)
Chloride: 107 mEq/L (ref 96–112)
GFR, Est African American: >89 mL/min
GFR, Est Non African American: >89 mL/min
Sodium: 143 mEq/L (ref 135–145)

## 2011-03-23 LAB — LIPID PANEL
Total CHOL/HDL Ratio: 4.3 Ratio
Triglycerides: 95 mg/dL (ref <150)

## 2011-03-23 MED ORDER — LEVOTHYROXINE SODIUM 25 MCG PO TABS: 25.0000 ug | ORAL_TABLET | Freq: Every day | ORAL | Status: DC

## 2011-05-04 ENCOUNTER — Ambulatory Visit: Payer: Medicare Other | Admitting: Family Medicine

## 2011-05-17 ENCOUNTER — Encounter: Payer: Self-pay | Admitting: Family Medicine

## 2011-05-17 ENCOUNTER — Ambulatory Visit (INDEPENDENT_AMBULATORY_CARE_PROVIDER_SITE_OTHER): Payer: Medicare Other | Admitting: Family Medicine

## 2011-05-17 VITALS — BP 114/73 | HR 95 | Wt 191.0 lb

## 2011-05-17 DIAGNOSIS — F411 Generalized anxiety disorder: Secondary | ICD-10-CM

## 2011-05-17 MED ORDER — IBUPROFEN 800 MG PO TABS: 800.0000 mg | ORAL_TABLET | Freq: Three times a day (TID) | ORAL | Status: AC | PRN

## 2011-05-17 MED ORDER — SERTRALINE HCL 100 MG PO TABS: 100.0000 mg | ORAL_TABLET | Freq: Every day | ORAL | Status: DC

## 2011-05-17 NOTE — Progress Notes (Signed)
  Subjective:    Patient ID: Tracey Norman, female    DOB: 1960-12-24, 51 y.o.   MRN: [id number]  HPI She is here to f/u on her anxiety. We changed her to sertaline per her request. So far feels it is working better and no SE thus far. She still has a hard time taking her meds consistantly. She says she has been fairly consistant ove the last 2-3 weeks.  She is not getting any regular exercise and says her sleep has been disturbed again. She has her nights and they switched.  For her arthritis pain she would like to have a prescription for ibuprofen as he typically cheaper if she gets it through her insurance.  Review of Systems     Objective:   Physical Exam  Constitutional: She is oriented to person, place, and time. She appears well-developed and well-nourished.  HENT:  Head: Normocephalic and atraumatic.  Cardiovascular: Normal rate, regular rhythm and normal heart sounds.   Pulmonary/Chest: Effort normal and breath sounds normal.  Neurological: She is alert and oriented to person, place, and time.  Skin: Skin is warm and dry.  Psychiatric: She has a normal mood and affect. Her behavior is normal.          Assessment & Plan:  Anxiety-her gad 7 score is 18 today. If she feels the medication is really helping her and she is doing well on it. Continue current regimen followup in one month. If at that time she's not closer to goal then we will adjust her medication. I also discussed getting regular exercise and explained howt this actually improved in its course.  Arthritis-ibuprofen 800mg  sent to pharmacy.

## 2011-05-21 ENCOUNTER — Other Ambulatory Visit: Payer: Self-pay | Admitting: Family Medicine

## 2011-08-31 ENCOUNTER — Encounter: Payer: Self-pay | Admitting: Physician Assistant

## 2011-08-31 ENCOUNTER — Ambulatory Visit (INDEPENDENT_AMBULATORY_CARE_PROVIDER_SITE_OTHER): Payer: Medicare Other | Admitting: Physician Assistant

## 2011-08-31 VITALS — BP 147/87 | HR 85

## 2011-08-31 DIAGNOSIS — J189 Pneumonia, unspecified organism: Secondary | ICD-10-CM

## 2011-08-31 DIAGNOSIS — E039 Hypothyroidism, unspecified: Secondary | ICD-10-CM

## 2011-08-31 DIAGNOSIS — R0789 Other chest pain: Secondary | ICD-10-CM

## 2011-08-31 DIAGNOSIS — R0602 Shortness of breath: Secondary | ICD-10-CM

## 2011-08-31 MED ORDER — ASPIRIN 325 MG PO TABS: 325.0000 mg | ORAL_TABLET | Freq: Every day | ORAL | Status: DC

## 2011-08-31 MED ORDER — NITROGLYCERIN 0.4 MG SL SUBL: 0.4000 mg | SUBLINGUAL_TABLET | Freq: Once | SUBLINGUAL | Status: DC

## 2011-08-31 MED ORDER — LEVOTHYROXINE SODIUM 100 MCG PO TABS: 100.0000 ug | ORAL_TABLET | Freq: Every day | ORAL | Status: DC

## 2011-08-31 NOTE — Patient Instructions (Addendum)
Since has EKG changes sent to Emergency Room.  Needs to start Levothyroxine back at daily in the morning. REcheck in 6 weeks.   Continue taking Levaquin for pneumonia.  Pt has been given ASA and Nitroglycerin .4mg  in office.

## 2011-08-31 NOTE — Progress Notes (Signed)
  Subjective:    Patient ID: Tracey Norman, female    DOB: 13-Sep-1960, 51 y.o.   MRN: [id number]  HPI Pt presents to clinic with SOB/chest pressure. She went to the ED 08/29/11 and dx with pneumonia. She was given levaquin. She has been taking it but has started to have pressure in chest and increasing SOB. She denies any fever but has felt sweaty, had chills, and been shaking all over. In ER she was evaluated for PE, EKG, CMP, CT of head, UA and the only dx was pneumonia. She was given Ativan in ER and that did make her feel better.    Fx: MI for both parents. CAD and triple aneurysm.   Review of Systems     Objective:   Physical Exam  Constitutional: She is oriented to person, place, and time. She appears well-developed and well-nourished.       Her legs were trembling.  HENT:  Head: Normocephalic and atraumatic.  Eyes: Conjunctivae are normal.  Neck: Normal range of motion. Neck supple.  Cardiovascular: Normal rate, regular rhythm, normal heart sounds and intact distal pulses.   Pulmonary/Chest: Effort normal and breath sounds normal. She has no wheezes.  Lymphadenopathy:    She has no cervical adenopathy.  Neurological: She is alert and oriented to person, place, and time.  Skin: She is diaphoretic.  Psychiatric: She has a normal mood and affect. Her behavior is normal.          Assessment & Plan:  SOB/Chest pressure- EKG acute changes since 8/28. Anterior looks like a possible bundle branch block that was not present previously. Due to ongoing worsening symptoms I believe that pt need to go to ER to have cardiac enzymes done. If everything checks out I believe she needs stress test done. Gave nitro and ASA in office. Pt's friend was called and she picked her up and took to ER.   Hypothyroidism- Pt's TSH was greater than 50 in ER. Pts need to be started on levo. Script called in for and to rechecked in 6 weeks. Pt was told to take in morning 30 min before breakfast.  Discussed that some of these symptoms could be a result of thyroid being so out of control.  PNE- cont on levaquin. F/U if not improving.

## 2011-09-12 ENCOUNTER — Telehealth: Payer: Self-pay | Admitting: *Deleted

## 2011-09-12 DIAGNOSIS — R0602 Shortness of breath: Secondary | ICD-10-CM

## 2011-09-12 DIAGNOSIS — R0789 Other chest pain: Secondary | ICD-10-CM

## 2011-09-13 ENCOUNTER — Ambulatory Visit: Payer: Medicare Other | Admitting: Family Medicine

## 2011-10-09 ENCOUNTER — Encounter: Payer: Self-pay | Admitting: Family Medicine

## 2011-10-09 ENCOUNTER — Ambulatory Visit (INDEPENDENT_AMBULATORY_CARE_PROVIDER_SITE_OTHER): Payer: Medicare Other | Admitting: Family Medicine

## 2011-10-09 ENCOUNTER — Ambulatory Visit (INDEPENDENT_AMBULATORY_CARE_PROVIDER_SITE_OTHER): Payer: Medicare Other

## 2011-10-09 VITALS — BP 123/82 | HR 72 | Ht 63.0 in | Wt 206.0 lb

## 2011-10-09 DIAGNOSIS — J189 Pneumonia, unspecified organism: Secondary | ICD-10-CM

## 2011-10-09 DIAGNOSIS — D539 Nutritional anemia, unspecified: Secondary | ICD-10-CM

## 2011-10-09 DIAGNOSIS — R0602 Shortness of breath: Secondary | ICD-10-CM

## 2011-10-09 DIAGNOSIS — R05 Cough: Secondary | ICD-10-CM

## 2011-10-09 DIAGNOSIS — R7989 Other specified abnormal findings of blood chemistry: Secondary | ICD-10-CM

## 2011-10-09 DIAGNOSIS — R5383 Other fatigue: Secondary | ICD-10-CM

## 2011-10-09 NOTE — Progress Notes (Signed)
  Subjective:    Patient ID: Tracey Norman, female    DOB: 1960-07-02, 51 y.o.   MRN: [id number]  HPI Dx with Pneumonia at [facility] on 9/26, approximately 2 weeks ago. Had a chest CT angio to rule out PE, bc she was so SOB. She was put on an antibiotic. She does not remember the name of the antibiotic. She is feeling much better overall, until last couple of days and feel more SOb and has felt more dizzy.  No fever.  Does have Slight cough.  Hasn't smoked in 5 months. No wheezing.  She is using e -cigs.    Review of Systems    Objective:   Physical Exam  Constitutional: She is oriented to person, place, and time. She appears well-developed and well-nourished.  HENT:  Head: Normocephalic and atraumatic.  Right Ear: External ear normal.  Left Ear: External ear normal.  Nose: Nose normal.  Mouth/Throat: Oropharynx is clear and moist.       TMs and canals are clear.   Eyes: Conjunctivae normal and EOM are normal. Pupils are equal, round, and reactive to light.  Neck: Neck supple. No thyromegaly present.  Cardiovascular: Normal rate, regular rhythm and normal heart sounds.   Pulmonary/Chest: Effort normal and breath sounds normal. She has no wheezes.  Lymphadenopathy:    She has no cervical adenopathy.  Neurological: She is alert and oriented to person, place, and time.  Skin: Skin is warm and dry.  Psychiatric: She has a normal mood and affect.          Assessment & Plan:  Pneumonia - Resolving but recurrent sxs last couple of days.  Repeat CXR today.  CBC w/ diff  Today as well. Wants to make sure that her symptoms are not recurring. If she could do just has some residuals signs on her chest x-ray. If she suddenly gets worse please let me know. She's not sure what antibiotic she took that we can certainly contact the hospital to find out. Her pulse ox is also reassuring today. I also explained her that he cigarettes could be causing some of her dizziness as well as  cough. I encouraged her to continue to wean down as much as possible.  We will try to call and get records from the hospital.

## 2011-10-09 NOTE — Patient Instructions (Addendum)
We will call with the lab and chest xray results.

## 2011-10-10 LAB — CBC WITH DIFFERENTIAL/PLATELET
Basophils Absolute: 0.1 10*3/uL (ref 0.0–0.1)
Basophils Relative: 1 % (ref 0–1)
HCT: 41.9 % (ref 36.0–46.0)
Hemoglobin: 13.4 g/dL (ref 12.0–15.0)
Lymphocytes Relative: 38 % (ref 12–46)
MCHC: 32 g/dL (ref 30.0–36.0)
Monocytes Relative: 6 % (ref 3–12)
Neutro Abs: 4.4 10*3/uL (ref 1.7–7.7)
Neutrophils Relative %: 51 % (ref 43–77)
WBC: 8.6 10*3/uL (ref 4.0–10.5)

## 2011-10-10 NOTE — Addendum Note (Signed)
Addended by: Ellsworth Lennox on: 10/10/2011 10:59 AM   Modules accepted: Orders

## 2011-11-15 ENCOUNTER — Ambulatory Visit (INDEPENDENT_AMBULATORY_CARE_PROVIDER_SITE_OTHER): Payer: Medicare Other

## 2011-11-15 ENCOUNTER — Ambulatory Visit (INDEPENDENT_AMBULATORY_CARE_PROVIDER_SITE_OTHER): Payer: Medicare Other | Admitting: Family Medicine

## 2011-11-15 ENCOUNTER — Encounter: Payer: Self-pay | Admitting: Family Medicine

## 2011-11-15 VITALS — BP 172/86 | HR 101 | Temp 97.7°F | Ht 63.0 in | Wt 209.0 lb

## 2011-11-15 DIAGNOSIS — F419 Anxiety disorder, unspecified: Secondary | ICD-10-CM

## 2011-11-15 DIAGNOSIS — R0602 Shortness of breath: Secondary | ICD-10-CM

## 2011-11-15 DIAGNOSIS — Z8701 Personal history of pneumonia (recurrent): Secondary | ICD-10-CM

## 2011-11-15 DIAGNOSIS — F17200 Nicotine dependence, unspecified, uncomplicated: Secondary | ICD-10-CM

## 2011-11-15 DIAGNOSIS — R0989 Other specified symptoms and signs involving the circulatory and respiratory systems: Secondary | ICD-10-CM

## 2011-11-15 DIAGNOSIS — F411 Generalized anxiety disorder: Secondary | ICD-10-CM

## 2011-11-15 DIAGNOSIS — R05 Cough: Secondary | ICD-10-CM

## 2011-11-15 LAB — BASIC METABOLIC PANEL
BUN: 14 mg/dL (ref 6–23)
Sodium: 137 mEq/L (ref 135–145)

## 2011-11-15 LAB — CBC WITH DIFFERENTIAL/PLATELET
Basophils Relative: 0 % (ref 0–1)
Eosinophils Absolute: 0.4 10*3/uL (ref 0.0–0.7)
Eosinophils Relative: 5 % (ref 0–5)
MCH: 32.2 pg (ref 26.0–34.0)
MCHC: 33.6 g/dL (ref 30.0–36.0)
MCV: 96 fL (ref 78.0–100.0)
Monocytes Relative: 8 % (ref 3–12)
Neutrophils Relative %: 40 % — ABNORMAL LOW (ref 43–77)
Platelets: 264 10*3/uL (ref 150–400)

## 2011-11-15 MED ORDER — METHYLPREDNISOLONE SODIUM SUCC 125 MG IJ SOLR
125.0000 mg | INTRAMUSCULAR | Status: DC
  Administered 2011-11-15: 125 mg via INTRAMUSCULAR

## 2011-11-15 MED ORDER — SERTRALINE HCL 100 MG PO TABS: 100.0000 mg | ORAL_TABLET | Freq: Every day | ORAL | Status: DC

## 2011-11-15 MED ORDER — AMOXICILLIN-POT CLAVULANATE 875-125 MG PO TABS: 1.0000 | ORAL_TABLET | Freq: Two times a day (BID) | ORAL | Status: DC

## 2011-11-15 MED ORDER — ALBUTEROL SULFATE HFA 108 (90 BASE) MCG/ACT IN AERS: 2.0000 | INHALATION_SPRAY | RESPIRATORY_TRACT | Status: AC | PRN

## 2011-11-15 MED ORDER — BUPROPION HCL ER (XL) 300 MG PO TB24: 300.0000 mg | ORAL_TABLET | ORAL | Status: DC

## 2011-11-15 MED ORDER — IPRATROPIUM BROMIDE 0.02 % IN SOLN: 0.5000 mg | Freq: Four times a day (QID) | RESPIRATORY_TRACT | Status: DC | PRN

## 2011-11-15 MED ORDER — ALBUTEROL SULFATE (5 MG/ML) 0.5% IN NEBU: 2.5000 mg | INHALATION_SOLUTION | Freq: Four times a day (QID) | RESPIRATORY_TRACT | Status: DC | PRN

## 2011-11-15 NOTE — Progress Notes (Signed)
  Subjective:    Patient ID: Tracey Norman, female    DOB: [birth year], 51 y.o.   MRN: [id number] #1 URI  This is a new problem. The current episode started in the past 7 days (worse at night unable to catch her breath.). The problem has been gradually worsening. Associated symptoms include congestion, coughing and headaches. Pertinent negatives include no abdominal pain, chest pain, diarrhea, nausea or rash. Associated symptoms comments: Feels a rushing and dizzy sensation in her head. . She has tried nothing for the symptoms. The treatment provided no relief.   #2 anxiety depression. Patient under a lot of stress in fact she is supposed to drive to [place] tonight to sit with her SO mother who recently had a heart attack. She also appears to be very stressed and it's not taking her Zoloft and the Valium which is also when necessary has not taken on a regular basis. I am concerned that she may have a level of depression as well as anxiety.  #3 elevated blood pressure. Blood pressure is elevated she is in a lot of stress.    Review of Systems  HENT: Positive for congestion.   Respiratory: Positive for cough.   Cardiovascular: Negative for chest pain.  Gastrointestinal: Negative for nausea, abdominal pain and diarrhea.  Skin: Negative for rash.  Neurological: Positive for headaches.      BP 172/86  Pulse 101  Temp 97.7 F (36.5 C) (Oral)  Ht 5\' 3"  (1.6 m)  Wt 209 lb (94.802 kg)  BMI 37.02 kg/m2  SpO2 98% Objective:   Physical Exam  Constitutional: She is oriented to person, place, and time. She appears well-developed and well-nourished.  HENT:  Head: Normocephalic.  Neck: Neck supple.  Cardiovascular: Normal rate and regular rhythm.   Pulmonary/Chest: Effort normal and breath sounds normal.  Neurological: She is alert and oriented to person, place, and time.  Skin: Skin is warm and dry.  Psychiatric: She has a normal mood and affect.      Assessment & Plan:  #1  shortness of breath/bronchitis/history of pneumonia. We'll place her on Augmentin 875, renew her  ProAire inhaler, Solu-Medrol 125 IM and also doing neb treatment here. We'll get a chest x-ray to make sure the pneumonia back. In regards to travel to [place] tonight if she has pneumonia would not recommend that she until sometime middle of next week. And if she does go on a trip I did recommend frequent stops to make sure she is okay.  #2 anxiety/depression. Renew Zoloft. Start Wellbutrin XL 300 a day. She has been Wellbutrin before and also explained to her this is more for depression hopefully help her stop smoking as well and will have her use of Valium on a when necessary basis. Follow Dr. Eppie Gibson in 2 weeks for further evaluations.  #3 tobacco abuse. See above.  #4 elevated blood pressure we'll have her follow up w/ Dr. Eppie Gibson in 2 weeks.   Patient notified that her chest x-ray was negative for pneumonia she may drive out of town but a very careful and stopped needed and follow up as discussed about.

## 2011-11-15 NOTE — Patient Instructions (Signed)

## 2011-11-16 ENCOUNTER — Other Ambulatory Visit: Payer: Self-pay | Admitting: Family Medicine

## 2011-11-16 ENCOUNTER — Telehealth: Payer: Self-pay | Admitting: *Deleted

## 2011-11-16 NOTE — Telephone Encounter (Signed)
Patient called and was following up on yesterdays visit with Dr Thurmond Butts, Dr. Linford Arnold reviewed notes and agreed. Patient needs to take ABT's, Valium and the steroid could be making her heart beat faster.  Not sure why it increases at night time. Patient aware that if she has any problems over the weekend to go to urgent care or ER.

## 2011-12-06 ENCOUNTER — Other Ambulatory Visit: Payer: Self-pay | Admitting: Family Medicine

## 2011-12-06 ENCOUNTER — Encounter: Payer: Self-pay | Admitting: Sports Medicine

## 2011-12-06 ENCOUNTER — Ambulatory Visit (INDEPENDENT_AMBULATORY_CARE_PROVIDER_SITE_OTHER): Payer: Medicare Other

## 2011-12-06 ENCOUNTER — Ambulatory Visit (INDEPENDENT_AMBULATORY_CARE_PROVIDER_SITE_OTHER): Payer: Medicare Other | Admitting: Sports Medicine

## 2011-12-06 VITALS — BP 155/83 | HR 100 | Temp 98.2°F | Wt 207.0 lb

## 2011-12-06 DIAGNOSIS — R05 Cough: Secondary | ICD-10-CM

## 2011-12-06 DIAGNOSIS — F411 Generalized anxiety disorder: Secondary | ICD-10-CM

## 2011-12-06 DIAGNOSIS — M545 Low back pain, unspecified: Secondary | ICD-10-CM

## 2011-12-06 DIAGNOSIS — R059 Cough, unspecified: Secondary | ICD-10-CM

## 2011-12-06 MED ORDER — ALPRAZOLAM 0.25 MG PO TABS: 0.2500 mg | ORAL_TABLET | Freq: Three times a day (TID) | ORAL | Status: DC | PRN

## 2011-12-06 MED ORDER — CITALOPRAM HYDROBROMIDE 20 MG PO TABS: 20.0000 mg | ORAL_TABLET | Freq: Every day | ORAL | Status: DC

## 2011-12-06 NOTE — Progress Notes (Signed)
Subjective:    CC: shortness of breath, weakness  HPI:  Tracey Norman is a pleasant but anxious 51 year old female who comes in with a one-day history of weakness, dizziness described as presyncope, anxiety, chest pressure that lasted all day, and is nonexertional. She also gets some occasional shortness of breath without cough, no fevers or chills.  Her skin gets clammy with these episodes.  She has been treated in the past for pneumonia, but on multiple return visits she has been treated as well, but has not had any objective evidence of pneumonia. She is recently had a normal CBC, as well as metabolic panel.  Past medical history, Surgical history, Family history, Social history, Allergies, and medications have been entered into the medical record, reviewed, and no changes needed.   Review of Systems: No fevers, chills, night sweats, weight loss, chest pain, or shortness of breath.   Objective:    General: Well Developed, well nourished, and in no acute distress.  Neuro: Alert and oriented x3, extra-ocular muscles intact. She appears markedly anxious. HEENT: Normocephalic, atraumatic, pupils equal round reactive to light, neck supple, no masses, no lymphadenopathy, thyroid nonpalpable.  Skin: Warm and dry, no rashes. Cardiac: Regular rate and rhythm, no murmurs rubs or gallops.  Respiratory: Clear to auscultation bilaterally. Not using accessory muscles, speaking in full sentences.  Chest x-ray was reviewed, and is negative for acute cardiopulmonary disease. Impression and Recommendations:   I spent over 40 minutes with this patient, greater than 50% was face-to-face counseling time.

## 2011-12-06 NOTE — Assessment & Plan Note (Signed)
We can discuss this at a future visit

## 2011-12-06 NOTE — Assessment & Plan Note (Addendum)
With negative stress test and negative pulmonary function testing in the recent past I do not think this is a primary cardiac or pulmonary disorder. Her episodic, non-exertional chest pain associated with shortness of breath, palpitations, diaphoresis, as well as anxiety, relieved with Valium most likely relate to panic disorders rather than the above. She tells [place] that she is not taking her Wellbutrin, Zoloft, or inhalers.  she has quit smoking. She also has a negative chest x-ray. I do not think she needs any antibiotics, she felt significantly better after talking today. I am going to add citalopram, as well as a short course of Xanax until citalopram kicks in. She is eager to try this. I would like to see her back in a couple of weeks

## 2011-12-10 ENCOUNTER — Telehealth: Payer: Self-pay | Admitting: Family Medicine

## 2011-12-10 NOTE — Telephone Encounter (Signed)
Patient is requesting to switch PCP to Dr. Karie Schwalbe if you are agreeable.  Please advise.  thanks

## 2011-12-15 NOTE — Telephone Encounter (Signed)
OK no problem. PCP changed.

## 2011-12-20 ENCOUNTER — Encounter: Payer: Self-pay | Admitting: Sports Medicine

## 2011-12-20 ENCOUNTER — Ambulatory Visit (INDEPENDENT_AMBULATORY_CARE_PROVIDER_SITE_OTHER): Payer: Medicare Other | Admitting: Sports Medicine

## 2011-12-20 VITALS — BP 131/80 | HR 98 | Wt 206.0 lb

## 2011-12-20 DIAGNOSIS — F411 Generalized anxiety disorder: Secondary | ICD-10-CM

## 2011-12-20 DIAGNOSIS — L720 Epidermal cyst: Secondary | ICD-10-CM | POA: Insufficient documentation

## 2011-12-20 DIAGNOSIS — L723 Sebaceous cyst: Secondary | ICD-10-CM

## 2011-12-20 NOTE — Progress Notes (Signed)
Subjective:    CC: Followup  HPI: Tracey Norman comes back to see me, for follow up of her anxiety. She had several episodes in the distant past, as well as recent past highly suggestive of panic disorder. She also has a basal level of worry. I placed her on citalopram, and advised her that she could still use her Xanax for short period of time as needed. She returns today feeling significantly better, and only having used a couple of Xanax. She is extremely happy with the results so far.  Nodule: She recently noticed this over her right elbow, it is nontender, and really doesn't bother her that much. She is wondering what it could be.  Past medical history, Surgical history, Family history, Social history, Allergies, and medications have been entered into the medical record, reviewed, and no changes needed.   Review of Systems: No fevers, chills, night sweats, weight loss, chest pain, or shortness of breath.   Objective:    General: Well Developed, well nourished, and in no acute distress.  Neuro: Alert and oriented x3, extra-ocular muscles intact.  HEENT: Normocephalic, atraumatic, pupils equal round reactive to light, neck supple, no masses, no lymphadenopathy, thyroid nonpalpable.  Skin: Warm and dry, no rashes. Cardiac: Regular rate and rhythm, no murmurs rubs or gallops.  Respiratory: Clear to auscultation bilaterally. Not using accessory muscles, speaking in full sentences. Right Elbow: Unremarkable to inspection. There is a visible nodule over the olecranon. The nodule approximately 1 cm, firm, easily movable, and very well-defined, and suggestive of an epidermal inclusion cyst. Range of motion full pronation, supination, flexion, extension. Strength is full to all of the above directions Stable to varus, valgus stress. Negative moving valgus stress test. No discrete areas of tenderness to palpation. Ulnar nerve does not sublux. Negative cubital tunnel Tinel's.  Impression and  Recommendations:

## 2011-12-20 NOTE — Assessment & Plan Note (Signed)
Symptoms greatly improved with the addition of citalopram, and an occasional alprazolam. This visit was to ensure she was not worsening after 2 weeks. I would like to see her back in 4 weeks, we will have her fill out a GAD 7 for each visit. We may increase citalopram to 40 mg if needed.

## 2011-12-20 NOTE — Assessment & Plan Note (Signed)
1 cm localized over right elbow. This is benign, I did advise her that we could remove this at a future visit if it started to bother her.

## 2012-01-25 ENCOUNTER — Ambulatory Visit: Payer: Medicare Other | Admitting: Sports Medicine

## 2012-02-08 ENCOUNTER — Encounter: Payer: Self-pay | Admitting: Sports Medicine

## 2012-02-08 ENCOUNTER — Ambulatory Visit (INDEPENDENT_AMBULATORY_CARE_PROVIDER_SITE_OTHER): Payer: Medicare Other | Admitting: Sports Medicine

## 2012-02-08 VITALS — BP 117/80 | HR 93 | Wt 211.0 lb

## 2012-02-08 DIAGNOSIS — E039 Hypothyroidism, unspecified: Secondary | ICD-10-CM

## 2012-02-08 DIAGNOSIS — F411 Generalized anxiety disorder: Secondary | ICD-10-CM

## 2012-02-08 MED ORDER — ALPRAZOLAM 0.25 MG PO TABS: 0.2500 mg | ORAL_TABLET | Freq: Three times a day (TID) | ORAL | Status: DC | PRN

## 2012-02-08 MED ORDER — CITALOPRAM HYDROBROMIDE 40 MG PO TABS: 40.0000 mg | ORAL_TABLET | Freq: Every day | ORAL | Status: DC

## 2012-02-08 NOTE — Assessment & Plan Note (Signed)
Symptoms are greatly improved on citalopram 20. I am increasing to 40, she'll come back to see me in one month, and if no better I can add BuSpar as an adjunct. I can also refill her Xanax.

## 2012-02-08 NOTE — Progress Notes (Signed)
Subjective:    CC: Followup  HPI: Tracey Norman comes back for followup of anxiety. I placed her on citalopram 20 mg daily, she notes that symptoms are significantly better, she is no longer as emotional, and no longer as labile. She is also less irritable. Unfortunately she still does get occasional panic attacks but much less frequently than before. She needs a refill on her Xanax.  Past medical history, Surgical history, Family history not pertinant except as noted below, Social history, Allergies, and medications have been entered into the medical record, reviewed, and no changes needed.   Review of Systems: No fevers, chills, night sweats, weight loss, chest pain, or shortness of breath.   Objective:    General: Well Developed, well nourished, and in no acute distress.  Neuro: Alert and oriented x3, extra-ocular muscles intact, sensation grossly intact.  HEENT: Normocephalic, atraumatic, pupils equal round reactive to light, neck supple, no masses, no lymphadenopathy, thyroid nonpalpable.  Skin: Warm and dry, no rashes. Cardiac: Regular rate and rhythm, no murmurs rubs or gallops.  Respiratory: Clear to auscultation bilaterally. Not using accessory muscles, speaking in full sentences. GAD 7 equals 14 Impression and Recommendations:

## 2012-02-16 ENCOUNTER — Other Ambulatory Visit: Payer: Self-pay

## 2012-03-14 ENCOUNTER — Other Ambulatory Visit: Payer: Self-pay | Admitting: Sports Medicine

## 2012-03-14 ENCOUNTER — Encounter: Payer: Self-pay | Admitting: Sports Medicine

## 2012-03-14 ENCOUNTER — Ambulatory Visit (INDEPENDENT_AMBULATORY_CARE_PROVIDER_SITE_OTHER): Payer: Medicare Other | Admitting: Sports Medicine

## 2012-03-14 VITALS — BP 123/80 | HR 74 | Wt 213.0 lb

## 2012-03-14 DIAGNOSIS — F411 Generalized anxiety disorder: Secondary | ICD-10-CM

## 2012-03-14 DIAGNOSIS — M797 Fibromyalgia: Secondary | ICD-10-CM

## 2012-03-14 DIAGNOSIS — E039 Hypothyroidism, unspecified: Secondary | ICD-10-CM

## 2012-03-14 DIAGNOSIS — IMO0001 Reserved for inherently not codable concepts without codable children: Secondary | ICD-10-CM

## 2012-03-14 MED ORDER — GABAPENTIN 300 MG PO CAPS: ORAL_CAPSULE | ORAL | Status: DC

## 2012-03-14 MED ORDER — LEVOTHYROXINE SODIUM 100 MCG PO TABS: 100.0000 ug | ORAL_TABLET | Freq: Every day | ORAL | Status: DC

## 2012-03-14 MED ORDER — PREDNISONE 50 MG PO TABS: ORAL_TABLET | ORAL | Status: DC

## 2012-03-14 NOTE — Progress Notes (Signed)
Subjective:    CC: Followup  HPI: Weight gain:  Feels as though she has had some dietary indiscretions.  Trying to start exercising.  Hypothyroidism: In our system appears as though she's supposed to be taking 100 mcg, she has recently run out for 25 mcg pills which is all she's been taking. She does feel fatigued, and has weight gain.  Low back pain: A pleasant lower lumbar spine, radiates down both legs in the lower lumbar distribution. Worse with sitting in the car for long periods of time. It is interfering with her workout regimen.   Past medical history, Surgical history, Family history not pertinant except as noted below, Social history, Allergies, and medications have been entered into the medical record, reviewed, and no changes needed.   Review of Systems: No fevers, chills, night sweats, weight loss, chest pain, or shortness of breath.   Objective:    General: Well Developed, well nourished, and in no acute distress.  Neuro: Alert and oriented x3, extra-ocular muscles intact, sensation grossly intact.  HEENT: Normocephalic, atraumatic, pupils equal round reactive to light, neck supple, no masses, no lymphadenopathy, thyroid nonpalpable.  Skin: Warm and dry, no rashes. Cardiac: Regular rate and rhythm, no murmurs rubs or gallops.  Respiratory: Clear to auscultation bilaterally. Not using accessory muscles, speaking in full sentences. Back Exam:  Inspection: Unremarkable  Motion: Flexion 45 deg, Extension 45 deg, Side Bending to 45 deg bilaterally,  Rotation to 45 deg bilaterally  SLR laying: Negative  XSLR laying: Negative  Palpable tenderness: None. FABER: negative. Sensory change: Gross sensation intact to all lumbar and sacral dermatomes.  Reflexes: 2+ at both patellar tendons, 2+ at achilles tendons, Babinski's downgoing.  Strength at foot  Plantar-flexion: 5/5 Dorsi-flexion: 5/5 Eversion: 5/5 Inversion: 5/5  Leg strength  Quad: 5/5 Hamstring: 5/5 Hip flexor: 5/5  Hip abductors: 5/5  Gait unremarkable. Impression and Recommendations:

## 2012-03-14 NOTE — Assessment & Plan Note (Signed)
It seems in the chart she supposed to be on 100 mcg of levothyroxine. Unfortunately she's only been taking , this could explain weight loss. I am going to add 100 mcg and recheck in 6 weeks.

## 2012-03-14 NOTE — Assessment & Plan Note (Signed)
Continue citalopram 40 mg daily. Can always consider adding BuSpar if needed.

## 2012-03-14 NOTE — Assessment & Plan Note (Addendum)
Gabapentin up-taper. She is having some radicular symptoms. I would add prednisone, x-rays, home exercises. We will recheck this in 6 weeks as well.

## 2012-04-25 ENCOUNTER — Ambulatory Visit (INDEPENDENT_AMBULATORY_CARE_PROVIDER_SITE_OTHER): Payer: Medicare Other | Admitting: Sports Medicine

## 2012-04-25 ENCOUNTER — Encounter: Payer: Self-pay | Admitting: Sports Medicine

## 2012-04-25 VITALS — BP 123/82 | HR 97 | Wt 212.0 lb

## 2012-04-25 DIAGNOSIS — E039 Hypothyroidism, unspecified: Secondary | ICD-10-CM

## 2012-04-25 DIAGNOSIS — M797 Fibromyalgia: Secondary | ICD-10-CM

## 2012-04-25 DIAGNOSIS — F411 Generalized anxiety disorder: Secondary | ICD-10-CM

## 2012-04-25 DIAGNOSIS — IMO0001 Reserved for inherently not codable concepts without codable children: Secondary | ICD-10-CM

## 2012-04-25 DIAGNOSIS — G4733 Obstructive sleep apnea (adult) (pediatric): Secondary | ICD-10-CM

## 2012-04-25 LAB — CBC
HCT: 43.8 % (ref 36.0–46.0)
Hemoglobin: 14.9 g/dL (ref 12.0–15.0)
MCH: 32.1 pg (ref 26.0–34.0)
MCHC: 34 g/dL (ref 30.0–36.0)
MCV: 94.4 fL (ref 78.0–100.0)
Platelets: 283 K/uL (ref 150–400)
RBC: 4.64 MIL/uL (ref 3.87–5.11)
RDW: 13.4 % (ref 11.5–15.5)
WBC: 9.3 K/uL (ref 4.0–10.5)

## 2012-04-25 NOTE — Assessment & Plan Note (Signed)
Continue CPAP.  

## 2012-04-25 NOTE — Progress Notes (Signed)
  Subjective:    CC: Follow up  HPI: Hypothyroidism: Due for a recheck, switched to a different dose of Synthroid 6 weeks ago.  Fibromyalgia: Has not yet started gabapentin.  Generalized anxiety disorder: Continues to improve with citalopram, unfortunately has noted difficulty sleeping, she often stays up at night and sleeps during the day. She is always tired, denies flight of ideas, pressured speech, or uncontrollable spending beyond means. She does note that she is significantly irritable. Her mother is sick, and she is having to pay debts from school. She feels as though everything is happening at once. Denies suicidal or homicidal ideation.  Past medical history, Surgical history, Family history not pertinant except as noted below, Social history, Allergies, and medications have been entered into the medical record, reviewed, and no changes needed.   Review of Systems: No fevers, chills, night sweats, weight loss, chest pain, or shortness of breath.   Objective:    General: Well Developed, well nourished, and in no acute distress.  Neuro: Alert and oriented x3, extra-ocular muscles intact, sensation grossly intact.  HEENT: Normocephalic, atraumatic, pupils equal round reactive to light, neck supple, no masses, no lymphadenopathy, thyroid nonpalpable.  Skin: Warm and dry, no rashes. Cardiac: Regular rate and rhythm, no murmurs rubs or gallops, no lower extremity edema.  Respiratory: Clear to auscultation bilaterally. Not using accessory muscles, speaking in full sentences. Impression and Recommendations:

## 2012-04-25 NOTE — Assessment & Plan Note (Signed)
Needs to start gabapentin, primarily at night.

## 2012-04-25 NOTE — Assessment & Plan Note (Signed)
Continue citalopram. She is having some adjustment disorder currently with a sick family member, bills, debts. She is currently having disordered sleep, staying awake through the night and sleeping through the day. No other symptoms of mania/hypomania. We will watch this.

## 2012-04-25 NOTE — Patient Instructions (Addendum)
Take gabapentin at bedtime. Try to get to sleep at the same time every night, and wake up at the same time every morning.

## 2012-04-25 NOTE — Assessment & Plan Note (Signed)
Rechecking TSH since we just changed her Synthroid dose.

## 2012-04-26 LAB — TSH: TSH: 14.503 u[IU]/mL — ABNORMAL HIGH (ref 0.350–4.500)

## 2012-04-28 ENCOUNTER — Other Ambulatory Visit: Payer: Self-pay | Admitting: Family Medicine

## 2012-04-28 MED ORDER — LEVOTHYROXINE SODIUM 150 MCG PO TABS: 150.0000 ug | ORAL_TABLET | Freq: Every day | ORAL | Status: AC

## 2012-04-28 NOTE — Addendum Note (Signed)
Addended by: Monica Becton on: 04/28/2012 08:33 AM   Modules accepted: Orders

## 2012-04-28 NOTE — Addendum Note (Signed)
Addended by: Monica Becton on: 04/28/2012 08:34 AM   Modules accepted: Orders

## 2012-04-29 ENCOUNTER — Other Ambulatory Visit: Payer: Self-pay | Admitting: Sports Medicine

## 2012-05-23 ENCOUNTER — Encounter: Payer: Self-pay | Admitting: Sports Medicine

## 2012-05-23 ENCOUNTER — Ambulatory Visit (INDEPENDENT_AMBULATORY_CARE_PROVIDER_SITE_OTHER): Payer: Medicare Other | Admitting: Sports Medicine

## 2012-05-23 VITALS — BP 131/81 | HR 81 | Wt 215.0 lb

## 2012-05-23 DIAGNOSIS — F411 Generalized anxiety disorder: Secondary | ICD-10-CM

## 2012-05-23 MED ORDER — SERTRALINE HCL 100 MG PO TABS: 100.0000 mg | ORAL_TABLET | Freq: Every day | ORAL | Status: AC

## 2012-05-23 MED ORDER — DIAZEPAM 5 MG PO TABS: 5.0000 mg | ORAL_TABLET | Freq: Every day | ORAL | Status: DC | PRN

## 2012-05-23 NOTE — Progress Notes (Signed)
  Subjective:    CC: Followup  HPI: Anxiety: Currently on citalopram 40 mg daily. Unfortunately still with persistent symptoms though they have improved since our initial encounter. She does use the alprazolam occasionally, but wonders if a more scheduled benzodiazepine would be effective.  She also notes improved symptoms in the past when on sertraline. She does continue to endorse inability to stop worrying, nervousness, trouble relaxing, restlessness, and irritability.  Past medical history, Surgical history, Family history not pertinant except as noted below, Social history, Allergies, and medications have been entered into the medical record, reviewed, and no changes needed.   Review of Systems: No fevers, chills, night sweats, weight loss, chest pain, or shortness of breath.   Objective:    General: Well Developed, well nourished, and in no acute distress.  Neuro: Alert and oriented x3, extra-ocular muscles intact, sensation grossly intact.  HEENT: Normocephalic, atraumatic, pupils equal round reactive to light, neck supple, no masses, no lymphadenopathy, thyroid nonpalpable.  Skin: Warm and dry, no rashes. Cardiac: Regular rate and rhythm, no murmurs rubs or gallops, no lower extremity edema.  Respiratory: Clear to auscultation bilaterally. Not using accessory muscles, speaking in full sentences. Impression and Recommendations:

## 2012-05-23 NOTE — Assessment & Plan Note (Signed)
Anxiety is persistent. Stopping Xanax, stopping citalopram. She desires to try sertraline which worked better in the past. Also adding Valium for longer acting effect. I would like her to also see our psychiatrist and therapist downstairs

## 2012-06-12 ENCOUNTER — Ambulatory Visit: Payer: Medicare Other | Admitting: Psychiatry

## 2012-06-20 ENCOUNTER — Ambulatory Visit: Payer: Medicare Other | Admitting: Sports Medicine

## 2012-06-20 DIAGNOSIS — Z0289 Encounter for other administrative examinations: Secondary | ICD-10-CM

## 2012-07-17 ENCOUNTER — Telehealth: Payer: Self-pay

## 2012-07-17 NOTE — Telephone Encounter (Signed)
PA approval from [employer] for Diazepam. Member ID# [id number]. Approved form 04/18/2012 to 10/17/2012. Rosalinda Seaman,CMA

## 2012-09-03 ENCOUNTER — Other Ambulatory Visit: Payer: Self-pay | Admitting: Sports Medicine

## 2012-11-06 ENCOUNTER — Other Ambulatory Visit: Payer: Self-pay

## 2012-11-22 ENCOUNTER — Other Ambulatory Visit: Payer: Self-pay | Admitting: Sports Medicine

## 2012-11-24 ENCOUNTER — Other Ambulatory Visit: Payer: Self-pay | Admitting: *Deleted

## 2012-11-24 MED ORDER — DIAZEPAM 5 MG PO TABS: ORAL_TABLET | ORAL | Status: AC

## 2014-01-02 ENCOUNTER — Emergency Department (INDEPENDENT_AMBULATORY_CARE_PROVIDER_SITE_OTHER): Payer: Medicare Other

## 2014-01-02 ENCOUNTER — Emergency Department (INDEPENDENT_AMBULATORY_CARE_PROVIDER_SITE_OTHER)
Admission: EM | Admit: 2014-01-02 | Discharge: 2014-01-02 | Disposition: A | Discharge status: Home / Self Care | Payer: Medicare Other | Source: Home / Self Care | Attending: Family Medicine | Admitting: Family Medicine

## 2014-01-02 ENCOUNTER — Encounter: Payer: Self-pay | Admitting: *Deleted

## 2014-01-02 DIAGNOSIS — M545 Low back pain, unspecified: Secondary | ICD-10-CM

## 2014-01-02 DIAGNOSIS — M79605 Pain in left leg: Principal | ICD-10-CM

## 2014-01-02 MED ORDER — PREDNISONE 20 MG PO TABS: ORAL_TABLET | ORAL | Status: AC

## 2014-01-02 MED ORDER — OXYCODONE-ACETAMINOPHEN 5-325 MG PO TABS: ORAL_TABLET | ORAL | Status: AC

## 2014-01-02 NOTE — Discharge Instructions (Signed)
Apply ice pack for 20 minutes, every 3 to 4 hours.  Continue until pain decreases.  Rest.  Begin Piriformis stretching exercises.

## 2014-01-02 NOTE — ED Provider Notes (Signed)
CSN: [id number]     Arrival date & time 01/02/14  1643 History   First MD Initiated Contact with Patient 01/02/14 1725     Chief Complaint  Patient presents with  . Back Pain   (Consider location/radiation/quality/duration/timing/severity/associated sxs/prior Treatment) HPI  Past Medical History  Diagnosis Date  . Obesity   . Allergy   . Depression   . Anxiety   . Asthma   . Chronic bronchitis   . Chronic hoarseness     From Reink'e edema  . Narcolepsy   . OSA (obstructive sleep apnea)   . LBP (low back pain)     chronic  . DDD (degenerative disc disease)    Past Surgical History  Procedure Laterality Date  . Arthroscopic knee surgery    . Tonsillectomy     Family History  Problem Relation Age of Onset  . Dementia Mother   . Heart attack Mother   . Diabetes      grandfather  . Heart attack Father     died 05-12-2006  . Pancreatic cancer Cousin   . Heart disease Brother   . Heart disease Other   . Lung cancer Mother    History  Substance Use Topics  . Smoking status: Former Smoker -- 1.00 packs/day for 30 years    Types: Cigarettes  . Smokeless tobacco: Never Used  . Alcohol Use: Not on file   OB History    No data available     Review of Systems  Allergies  Cymbalta and Erythromycin  Home Medications   Prior to Admission medications   Medication Sig Start Date End Date Taking? Authorizing Provider  diazepam (VALIUM) 5 MG tablet TAKE 1 TABLET BY MOUTH AS NEEDED FOR ANXIETY 11/24/12  Yes Monica Becton, MD  gabapentin (NEURONTIN) 300 MG capsule SEE NOTES 03/14/12  Yes Monica Becton, MD  levothyroxine (SYNTHROID, LEVOTHROID) 150 MCG tablet Take 1 tablet (150 mcg total) by mouth daily. 04/28/12  Yes Monica Becton, MD  LORazepam (ATIVAN) 0.5 MG tablet Take 0.25 mg by mouth every 8 (eight) hours.   Yes Historical Provider, MD  sertraline (ZOLOFT) 100 MG tablet Take 1 tablet (100 mg total) by mouth daily. 05/23/12  Yes Monica Becton,  MD  albuterol (PROAIR HFA) 108 (90 BASE) MCG/ACT inhaler Inhale 2 puffs into the lungs every 4 (four) hours as needed for wheezing. 11/15/11   Jeanelle Malling, MD  oxyCODONE-acetaminophen (ROXICET) 5-325 MG per tablet Take one tab by mouth at bedtime as needed for pain 01/02/14   Lattie Haw, MD  predniSONE (DELTASONE) 20 MG tablet Take one tab by mouth two times daily for five days, then one daily for three days. Take with food. 01/02/14   Lattie Haw, MD   BP 122/84 mmHg  Pulse 67  Resp 14  Wt 221 lb (100.245 kg)  SpO2 96% Physical Exam  ED Course  Procedures (including critical care time) Labs Review Labs Reviewed - No data to display  Imaging Review Dg Lumbar Spine Complete  01/02/2014   CLINICAL DATA:  Acute lower back pain radiating to both lower extremities for 1 and half weeks. No known injury.  EXAM: LUMBAR SPINE - COMPLETE 4+ VIEW  COMPARISON:  March 27, 2007.  FINDINGS: There is no evidence of lumbar spine fracture. Alignment is normal. Posterior facet joints appear normal. Intervertebral disc spaces are maintained.  IMPRESSION: Normal lumbar spine.   Electronically Signed   By: Marry Guan.D.  On: 01/02/2014 18:24     MDM   1. Low back pain radiating to left lower extremity; suspect piriformis syndrome   2. Low back pain radiating to right lower extremity; suspect piriformis syndrome    Begin prednisone taper.  Percocet for night time pain (5-325; #12, no refill). Apply ice pack for 20 minutes, every 3 to 4 hours.  Continue until pain decreases.  Rest.  Begin Piriformis stretching exercises. Followup with sports medicine clinic as soon as possible.     Lattie Haw, MD 01/02/14 [phone]

## 2014-01-02 NOTE — ED Notes (Signed)
Pt c/o low back and leg pain x 1 1/2 weeks without injury. H/o bulging lumbar disc and fibromyalgia. Used heat and ice and "muscle relaxer" without relief.

## 2016-04-09 IMAGING — CR DG LUMBAR SPINE COMPLETE 4+V
5 series · 5 of 5 positions shown · non-contrast
Comparison: March 27, 2007.

CLINICAL DATA: Acute lower back pain radiating to both lower
extremities for 1 and half weeks. No known injury.

EXAM:
LUMBAR SPINE - COMPLETE 4+ VIEW

[view not recorded (1 of 5)]
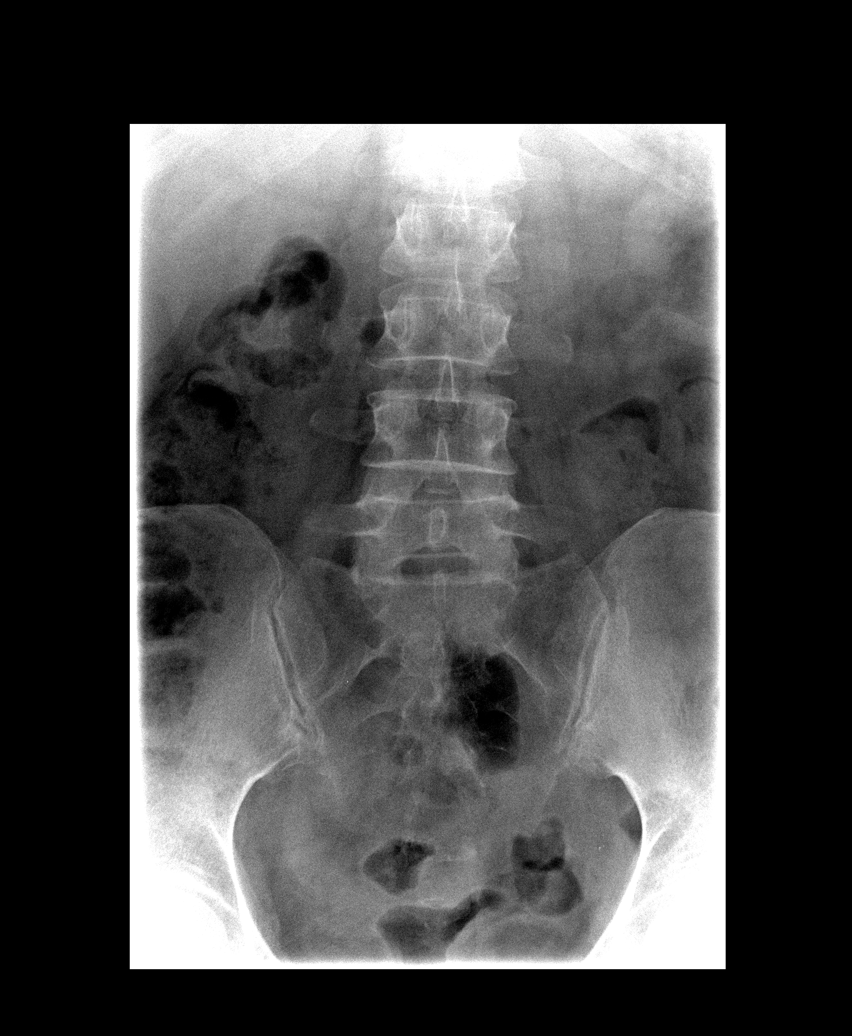

[view not recorded (2 of 5)]
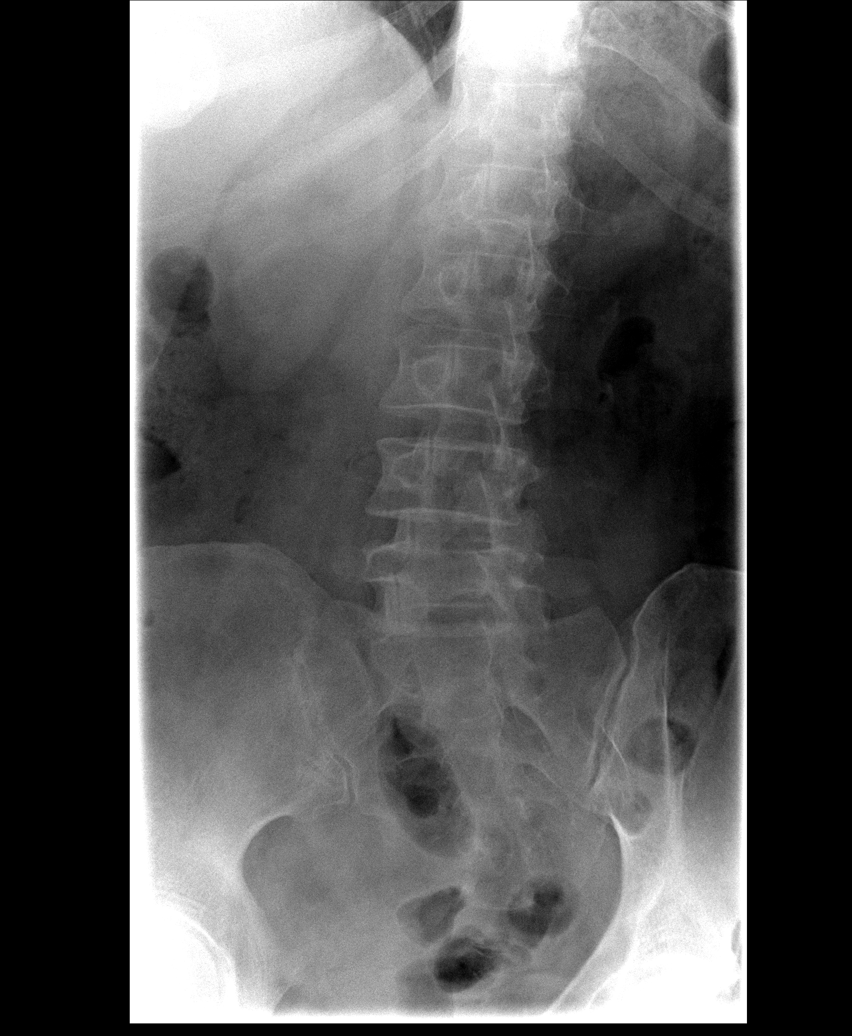

[view not recorded (3 of 5)]
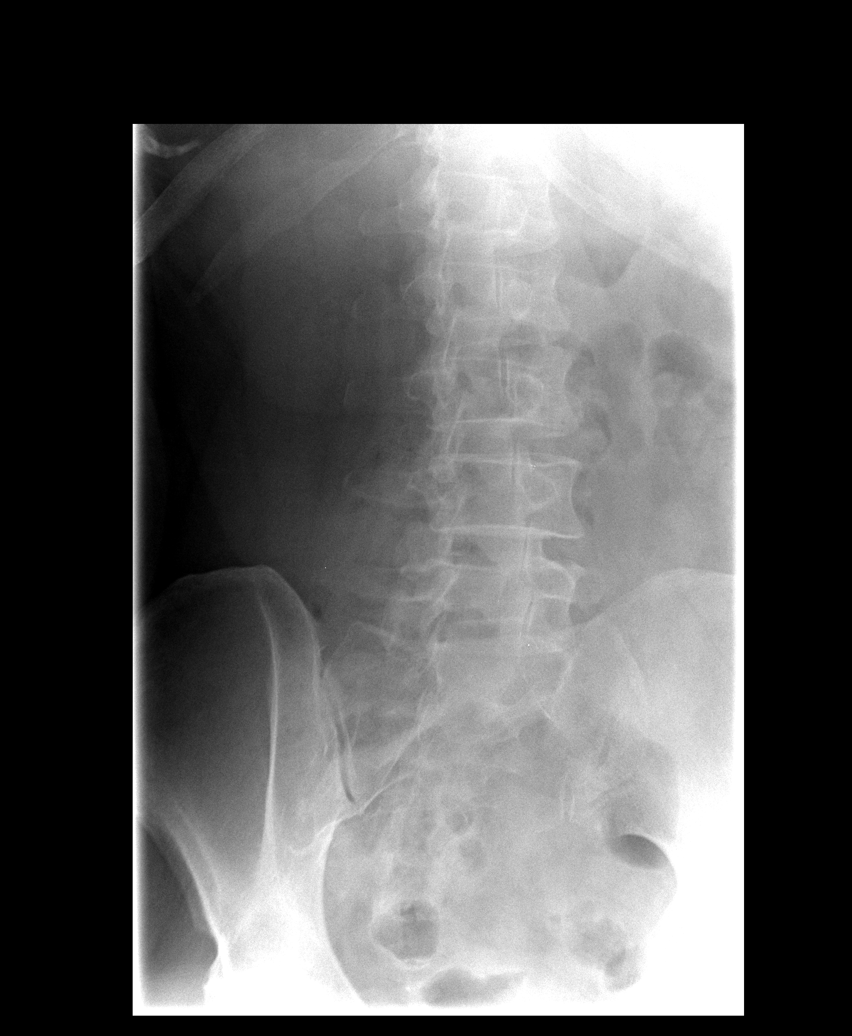

[view not recorded (4 of 5)]
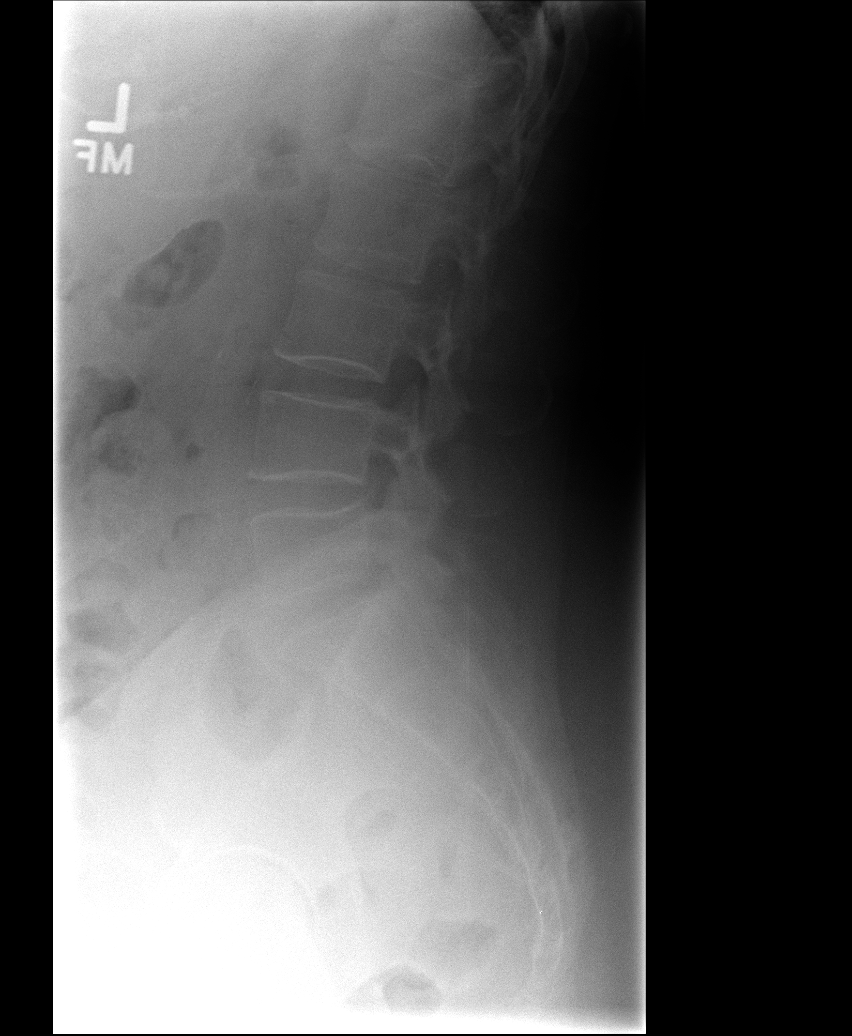

[view not recorded (5 of 5)]
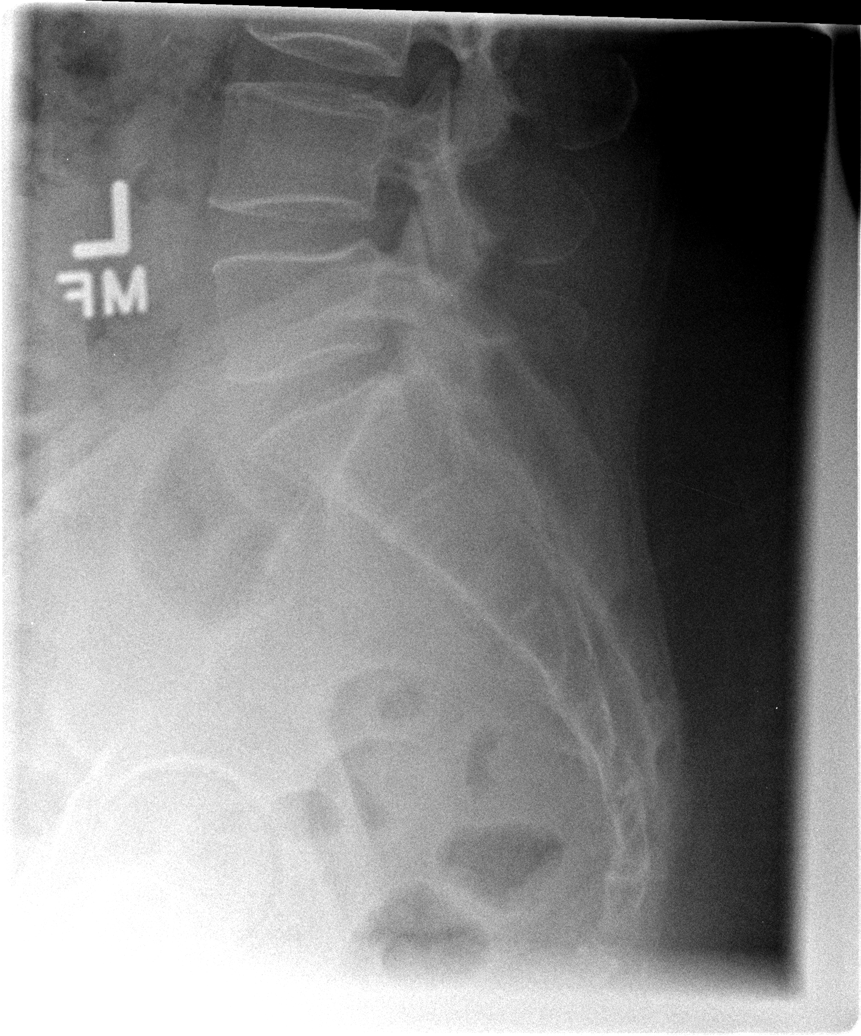

[5 of 5 positions shown; findings below may reference images not displayed]

FINDINGS: There is no evidence of lumbar spine fracture. Alignment is normal.
Posterior facet joints appear normal. Intervertebral disc spaces are
maintained.
IMPRESSION: Normal lumbar spine.

## 2022-02-01 DEATH — deceased
